# Patient Record
Sex: Female | Born: 1960 | Race: Black or African American | Hispanic: No | Marital: Single | Smoking: Never smoker
Health system: Southern US, Community
[De-identification: ages and names within clinical notes are randomized; demographics above are authoritative.]

## PROBLEM LIST (undated history)

## (undated) DIAGNOSIS — I10 Essential (primary) hypertension: Secondary | ICD-10-CM

## (undated) DIAGNOSIS — M1990 Unspecified osteoarthritis, unspecified site: Secondary | ICD-10-CM

## (undated) HISTORY — PX: BREAST LUMPECTOMY: SHX2

## (undated) HISTORY — DX: Essential (primary) hypertension: I10

---

## 2008-10-16 ENCOUNTER — Encounter: Payer: Self-pay | Admitting: Obstetrics & Gynecology

## 2008-10-16 ENCOUNTER — Ambulatory Visit: Payer: Self-pay | Admitting: Obstetrics and Gynecology

## 2008-10-16 LAB — CONVERTED CEMR LAB
HCT: 40.6 % (ref 36.0–46.0)
Hemoglobin: 14.4 g/dL (ref 12.0–15.0)
MCHC: 35.5 g/dL (ref 30.0–36.0)
MCV: 83.5 fL (ref 78.0–100.0)
RDW: 13.7 % (ref 11.5–15.5)

## 2008-10-28 ENCOUNTER — Ambulatory Visit (HOSPITAL_COMMUNITY): Admission: RE | Admit: 2008-10-28 | Discharge: 2008-10-28 | Payer: Self-pay | Admitting: Obstetrics and Gynecology

## 2010-06-29 ENCOUNTER — Encounter: Payer: Self-pay | Admitting: *Deleted

## 2010-10-20 NOTE — Group Therapy Note (Signed)
NAME:  [name], [name] NO.:  [id number]   MEDICAL RECORD NO.:  [id number]          PATIENT TYPE:  WOC   LOCATION:  [facility]                   FACILITY:  [facility]   PHYSICIAN:  Argentina Donovan, MD        DATE OF BIRTH:  [birth year]   DATE OF SERVICE:                                  CLINIC NOTE   HISTORY OF PRESENT ILLNESS:  Patient is a 50 year old female who was  referred from [facility] where she had received a Pap  in January that was normal and told that she had a large fibroid uterus  and that this was likely the cause of her urinary incontinence.  The  patient reports that she has had some urinary incontinence with coughing  and sneezing and occasionally some urge incontinence.  She states that  this is not to the point of inhibiting her function or affecting her  lifestyle.  She states that she preemptively will urinate every 2 hours  and this seems to help.  She does wear a pantyliner daily but states she  only has to change it twice a day, she states that it is rarely soaked.  She also does report having longer periods over the past year.  She  states that previously they used to last only 5 - 7 days and currently  over the past year they have been lasting up to 2 - 3 week and on  average 2 weeks.  Her last menstrual period was September 16, 2008.  She  states her periods are occurring monthly and denies any vaginal bleeding  between periods.  She states that she passes clots occasionally and that  the bleeding is varied from light spotting, to moderate, to heavy flow.  She has approximately 3 - 5 days of heavy bleeding and she will go  through 5 - 6 tampons a day.  She otherwise denies any symptoms of  orthostasis, dizziness or shortness of breath.   A 14 point review of systems was reviewed with patient and was negative  except as noted in the HPI.  Also, positive for some mild vasomotor  symptoms.   MEDICATIONS:  None.   ALLERGIES:  NO KNOWN  DRUG ALLERGIES.  SHE HAS NO LATEX ALLERGY.   GYN HISTORY:  Last Pap was July 01, 2008 and was normal per records.  Last mammogram was 1996.  Last menstrual period was September 16, 2008.  Age  of menarche was 40.  Cycles are typically every 30 days, lasting  approximately 18 days.  She uses no birth control methods.  She is G0  P0.  She has had one abnormal Pap smear approximately 20 years ago and  did not receive any treatment other than a repeat Pap smear that was  normal per patient report.   SURGICAL HISTORY:  Lumpectomy of the right breast in 1996.  She has  never received blood transfusion.   FAMILY HISTORY:  Significant for diabetes in her mother and father and  hypertension in her father.  No history of heart attacks or heart  disease or cancer.   PAST MEDICAL HISTORY:  None.  SOCIAL HISTORY:  Patient currently lives with her sister, brother-in-law  and niece.  She currently does not work outside the home.  She denies  any history of tobacco use, no alcohol use and she denies any drug use.  She does drink caffeinated beverages about 3 times a week.   LABS:  Pap smear dated July 02, 2008:  She was negative for  intraepithelial lesion or malignancy.   PHYSICAL EXAMINATION:  Temperature was 97.3, pulse is 70, blood pressure  right upper arms was 171/100, left lower arm or wrist was 136/83, weight  is 287 pounds or 130 kg.  GENERAL:  African American female who is in no acute distress, alert and  oriented, obese.  HEENT:  Mild conjunctival pallor, mucous membranes are moist.  Pupils  are equally round and reactive to light.  NECK:  Supple, there is no thyromegaly.  LUNGS:  Clear to auscultation bilaterally.  CARDIOVASCULAR:  Regular rate and rhythm, no murmurs.  She has 2+  peripheral pulses.  No clubbing and no cyanosis.  There are no carotid bruits.  ABDOMEN:  Soft, nontender, nondistended, bowel sounds are present.  GENITOURINARY:  There is no visible bladder  prolapse.  Cervix showed no  lesions or ulcerations, no discharge.  Bimanual exam was benign.  No  adnexal tenderness, no masses.  Exam was difficult secondary to large  body habitus.  EXTREMITIES:  Trace pedal edema, 2+ deep tendon reflexes.  PSYCH:  Normal affect, normal mood.   ASSESSMENT:  1. Menorrhagia.  2. Stress/urge incontinence.  3. Health maintenance.   PLAN:  1. For her menorrhagia we will order a transvaginal ultrasound and      check a CBC today to rule out anemia.  We will also be looking for      fibroids versus endometrial hypoplasia.  2. For her stress and urge incontinence, patient is currently not      limited in her function or lifestyle by this currently.  We      discussed Kegel's exercises and recommend that she do this daily.      We will also follow up on her transvaginal ultrasound to see if      there is indeed a large fibroid that could be contributing to this.  3. Health maintenance.  We will update her mammogram and we will refer      her for scholarship for this today.  She was also noted to have an      elevated blood pressure in clinic today.  We will have her follow      up in 2 weeks to go over results of her transvaginal ultrasound as      well as recheck her blood pressure. If it continues to be elevated      we will consider treatment at that time.  Patient was also advised      to check ambulatory blood pressures and bring those in at her next      followup.           ______________________________  Argentina Donovan, MD     PR/MEDQ  D:  10/16/2008  T:  10/16/2008  Job:  [id number]

## 2011-06-02 ENCOUNTER — Other Ambulatory Visit: Payer: Self-pay | Admitting: Obstetrics and Gynecology

## 2011-06-02 DIAGNOSIS — Z1231 Encounter for screening mammogram for malignant neoplasm of breast: Secondary | ICD-10-CM

## 2011-06-04 ENCOUNTER — Ambulatory Visit (INDEPENDENT_AMBULATORY_CARE_PROVIDER_SITE_OTHER): Payer: Self-pay | Admitting: *Deleted

## 2011-06-04 ENCOUNTER — Ambulatory Visit (HOSPITAL_COMMUNITY)
Admission: RE | Admit: 2011-06-04 | Discharge: 2011-06-04 | Disposition: A | Discharge status: Home / Self Care | Payer: Self-pay | Source: Ambulatory Visit | Attending: Obstetrics and Gynecology | Admitting: Obstetrics and Gynecology

## 2011-06-04 VITALS — BP 140/82 | HR 78 | Temp 98.5°F | Resp 18 | Ht 64.0 in | Wt 278.2 lb

## 2011-06-04 DIAGNOSIS — Z01419 Encounter for gynecological examination (general) (routine) without abnormal findings: Secondary | ICD-10-CM

## 2011-06-04 DIAGNOSIS — Z1231 Encounter for screening mammogram for malignant neoplasm of breast: Secondary | ICD-10-CM

## 2011-06-04 NOTE — Patient Instructions (Addendum)
Taught patient how to perform BSE and gave educational materials to take home. Informed patient if this Pap smear is normal will not need another Pap smear for 3 years per BCCCP guidelines. Patient escorted to mammography for a screening mammogram. Referred patient to one of our free skin cancer screenings in May 2013 to have the multiple moles on her breasts checked. Will mail patient flyer with dates and times. Let patient know will follow up with her within the next couple weeks with results by letter or phone. Patient verbalized understanding.

## 2011-06-04 NOTE — Progress Notes (Signed)
No complaints today.  Pap Smear:    Completed Pap smear today. Last Pap smear was February 2010 per patient. Per patient had an abnormal Pap smear 1993 with atypical cells and stated she had a repeat Pap smear that was normal to follow up. No Pap smear results in EPIC.  Physical exam: Breasts Breasts symmetrical. Multiple moles bilateral breasts mainly in the areolar area. No nipple retraction bilateral breasts. No nipple discharge bilateral breasts. No lymphadenopathy. No lumps palpated bilateral breasts. Referred patient to come to one of our free skin cancer screenings in May 2013 to have the moles checked on her breasts.          Pelvic/Bimanual   Ext Genitalia No lesions, no swelling and no discharge observed on external genitalia.         Vagina Vagina pink and normal texture. No lesions or discharge observed in vagina.          Cervix Cervix is present. Cervix pink and of normal texture. No discharge observed at cervical os.      Uterus Uterus is present and palpable. Uterus in normal position and normal size.       Adnexae Bilateral ovaries present and palpable. No tenderness on palpation.        Rectovaginal No rectal exam completed today since patient had no rectal complaints. No skin abnormalities observed on rectal area.

## 2011-06-16 ENCOUNTER — Encounter: Payer: Self-pay | Admitting: Obstetrics and Gynecology

## 2016-05-12 ENCOUNTER — Telehealth (INDEPENDENT_AMBULATORY_CARE_PROVIDER_SITE_OTHER): Payer: Self-pay | Admitting: Orthopaedic Surgery

## 2016-05-12 NOTE — Telephone Encounter (Signed)
Patient aware we did receive disc

## 2016-05-12 NOTE — Telephone Encounter (Signed)
Patient has appt. Tomorrow 12/7.Marland Kitchen.[facility] to know [facility] .please call pt [phone].

## 2016-05-13 ENCOUNTER — Ambulatory Visit (INDEPENDENT_AMBULATORY_CARE_PROVIDER_SITE_OTHER): Payer: BLUE CROSS/BLUE SHIELD | Admitting: Orthopaedic Surgery

## 2016-05-13 DIAGNOSIS — M1611 Unilateral primary osteoarthritis, right hip: Secondary | ICD-10-CM

## 2016-05-13 DIAGNOSIS — M1612 Unilateral primary osteoarthritis, left hip: Secondary | ICD-10-CM

## 2016-05-13 DIAGNOSIS — M25552 Pain in left hip: Secondary | ICD-10-CM | POA: Diagnosis not present

## 2016-05-13 DIAGNOSIS — M25551 Pain in right hip: Secondary | ICD-10-CM | POA: Diagnosis not present

## 2016-05-13 NOTE — Progress Notes (Signed)
Office Visit Note   Patient: [name]           Date of Birth: [birth year]           MRN: [id number] Visit Date: 05/13/2016              Requested by: No referring provider defined for this encounter. PCP: No PCP Per Patient   Assessment & Plan: Visit Diagnoses:  1. Pain of both hip joints   2. Unilateral primary osteoarthritis, left hip   3. Unilateral primary osteoarthritis, right hip     Plan: At this point her pain is daily. It is detrimentally affected her activities daily living, her mobility, and her quality of life. She is tried and failed all forms conservative treatment. Injections would not help at this point. She even has tried an assistive device, activity modification, and weight loss. I share with her her x-rays and we went over in detail what they look like. We also talked about hip replacement surgery under direct anterior approach. I showed her hip model and explained in detail with the surgery involves as well as a thorough discussion of the risks and benefits. Certainly her risk is heightened given her obesity. She is not a diabetic. I gave her our surgery scheduler's card and I'm happy to have this scheduled for a right hip replacement if she decides to have this done. I'll be happy to do my best good care of her. I described what the intraoperative and postoperative care would be as well. 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 I talked her about what it would take to get her back to that job when she decides whether or not she will have this surgery.  Follow-Up Instructions: Return if symptoms worsen or fail to improve.   Orders:  No orders of the defined types were placed in this encounter.  No orders of the defined types were placed in this encounter.     Procedures: No procedures performed   Clinical Data: No additional findings.   Subjective: Chief Complaint  Patient presents with  . Right Hip - Pain    Patient  having hip painx2 years. NKI. [place]    HPI Her sister is with her. Her pain is 10 out of 10. She is hurting on a daily basis. She is having trouble getting out of bed and getting into a car. She says when she first stands she has severe pain in the groin on both sides. She walks with significant limp as well. This is been getting worse for 2 years. Review of Systems She denies any headache, chest pain, shortness of breath, fever, chills, nausea, vomiting.  Objective: Vital Signs: There were no vitals taken for this visit.  Physical Exam She is alert and oriented 3 and in no acute distress. Ortho Exam Examination of both of her hips shows limitations with internal/external rotation and severe pain in the groin. I had her lay supine on the examination table second assess the soft tissue interval to get to her right hip. She does have pain in both of her knees and a slight valgus deformity of her knees. Specialty Comments:  No specialty comments available.  Imaging: No results found. X-rays accompany her show severe arthritis in both of her hips. The right is worse in the left. There is joint space narrowing, sclerotic changes and periarticular osteophytes. There also cystic  changes as well.  PMFS History: There are no active problems to display for this patient.  Past Medical History:  Diagnosis Date  . Hypertension     Family History  Problem Relation Age of Onset  . Diabetes Father   . Hypertension Father   . Diabetes Mother   . Hypertension Brother     No past surgical history on file. Social History   Occupational History  . Not on file.   Social History Main Topics  . Smoking status: Never Smoker  . Smokeless tobacco: Not on file  . Alcohol use No  . Drug use: No  . Sexual activity: No

## 2016-08-03 ENCOUNTER — Telehealth (INDEPENDENT_AMBULATORY_CARE_PROVIDER_SITE_OTHER): Payer: Self-pay | Admitting: Orthopaedic Surgery

## 2016-08-03 NOTE — Telephone Encounter (Signed)
Patient aware to stop 5 days before surgery

## 2016-08-03 NOTE — Telephone Encounter (Signed)
Patient called wanting to know when she should stop taking her 800 mg motrin before her surgery at the end of march. CB # [phone]

## 2016-08-06 NOTE — Progress Notes (Signed)
Need orders in epic for 3-23 surgery

## 2016-08-17 ENCOUNTER — Other Ambulatory Visit (INDEPENDENT_AMBULATORY_CARE_PROVIDER_SITE_OTHER): Payer: Self-pay | Admitting: Physician Assistant

## 2016-08-17 NOTE — Progress Notes (Signed)
Surgery on 08/27/16.  Preop on 08/20/16.  Need orders in epic.  Thank You

## 2016-08-18 ENCOUNTER — Other Ambulatory Visit (HOSPITAL_COMMUNITY): Payer: Self-pay | Admitting: Emergency Medicine

## 2016-08-18 ENCOUNTER — Encounter (HOSPITAL_COMMUNITY): Payer: Self-pay

## 2016-08-18 NOTE — Patient Instructions (Signed)
[name]  08/18/2016   Your procedure is scheduled on: 08-27-16  [facility] to 3rd floor to  Short Stay Center at 530AM.  Call this number if you have problems the morning of surgery [phone]   Remember: ONLY 1 PERSON MAY GO WITH YOU TO SHORT STAY TO GET  READY MORNING OF YOUR SURGERY.  Do not eat food or drink liquids :After Midnight.     Take these medicines the morning of surgery with A SIP OF WATER: none                                 You may not have any metal on your body including hair pins and              piercings  Do not wear jewelry, make-up, lotions, powders or perfumes, deodorant             Do not wear nail polish.  Do not shave  48 hours prior to surgery.              Men may shave face and neck.   Do not bring valuables to the hospital. [facility] IS NOT             RESPONSIBLE   FOR VALUABLES.  Contacts, dentures or bridgework may not be worn into surgery.  Leave suitcase in the car. After surgery it may be brought to your room.              Please read over the following fact sheets you were given: _____________________________________________________________________             [facility] - Preparing [facility] Before surgery, you can play an important role.  Because skin is not sterile, your skin needs to be as free of germs as possible.  You can reduce the number of germs on your skin by washing with CHG (chlorahexidine gluconate) soap before surgery.  CHG is an antiseptic cleaner which kills germs and bonds with the skin to continue killing germs even after washing. Please DO NOT use if you have an allergy to CHG or antibacterial soaps.  If your skin becomes reddened/irritated stop using the CHG and inform your nurse when you arrive at Short Stay. Do not shave (including legs and underarms) for at least 48 hours prior to the first CHG shower.  You may shave your  face/neck. Please follow these instructions carefully:  1.  Shower with CHG Soap the night before surgery and the  morning of Surgery.  2.  If you choose to wash your hair, wash your hair first as usual with your  normal  shampoo.  3.  After you shampoo, rinse your hair and body thoroughly to remove the  shampoo.                           4.  Use CHG as you would any other liquid soap.  You can apply chg directly  to the skin and wash                       Gently with a scrungie or clean washcloth.  5.  Apply the CHG Soap to your body ONLY  FROM THE NECK DOWN.   Do not use on face/ open                           Wound or open sores. Avoid contact with eyes, ears mouth and genitals (private parts).                       Wash face,  Genitals (private parts) with your normal soap.             6.  Wash thoroughly, paying special attention to the area where your surgery  will be performed.  7.  Thoroughly rinse your body with warm water from the neck down.  8.  DO NOT shower/wash with your normal soap after using and rinsing off  the CHG Soap.                9.  Pat yourself dry with a clean towel.            10.  Wear clean pajamas.            11.  Place clean sheets on your bed the night of your first shower and do not  sleep with pets. Day of Surgery : Do not apply any lotions/deodorants the morning of surgery.  Please wear clean clothes to the hospital/surgery center.  FAILURE TO FOLLOW THESE INSTRUCTIONS MAY RESULT IN THE CANCELLATION OF YOUR SURGERY PATIENT SIGNATURE_________________________________  NURSE SIGNATURE__________________________________  ________________________________________________________________________   [name]  An incentive spirometer is a tool that can help keep your lungs clear and active. This tool measures how well you are filling your lungs with each breath. Taking long deep breaths may help reverse or decrease the chance of developing breathing  (pulmonary) problems (especially infection) following:  A long period of time when you are unable to move or be active. BEFORE THE PROCEDURE   If the spirometer includes an indicator to show your best effort, your nurse or respiratory therapist will set it to a desired goal.  If possible, sit up straight or lean slightly forward. Try not to slouch.  Hold the incentive spirometer in an upright position. INSTRUCTIONS FOR USE  1. Sit on the edge of your bed if possible, or sit up as far as you can in bed or on a chair. 2. Hold the incentive spirometer in an upright position. 3. Breathe out normally. 4. Place the mouthpiece in your mouth and seal your lips tightly around it. 5. Breathe in slowly and as deeply as possible, raising the piston or the ball toward the top of the column. 6. Hold your breath for 3-5 seconds or for as long as possible. Allow the piston or ball to fall to the bottom of the column. 7. Remove the mouthpiece from your mouth and breathe out normally. 8. Rest for a few seconds and repeat Steps 1 through 7 at least 10 times every 1-2 hours when you are awake. Take your time and take a few normal breaths between deep breaths. 9. The spirometer may include an indicator to show your best effort. Use the indicator as a goal to work toward during each repetition. 10. After each set of 10 deep breaths, practice coughing to be sure your lungs are clear. If you have an incision (the cut made at the time of surgery), support your incision when coughing by placing a pillow or rolled up towels firmly against it. Once you  are able to get out of bed, walk around indoors and cough well. You may stop using the incentive spirometer when instructed by your caregiver.  RISKS AND COMPLICATIONS  Take your time so you do not get dizzy or light-headed.  If you are in pain, you may need to take or ask for pain medication before doing incentive spirometry. It is harder to take a deep breath if you  are having pain. AFTER USE  Rest and breathe slowly and easily.  It can be helpful to keep track of a log of your progress. Your caregiver can provide you with a simple table to help with this. If you are using the spirometer at home, follow these instructions: [place] IF:   You are having difficultly using the spirometer.  You have trouble using the spirometer as often as instructed.  Your pain medication is not giving enough relief while using the spirometer.  You develop fever of 100.5 F (38.1 C) or higher. SEEK IMMEDIATE MEDICAL CARE IF:   You cough up bloody sputum that had not been present before.  You develop fever of 102 F (38.9 C) or greater.  You develop worsening pain at or near the incision site. MAKE SURE YOU:   Understand these instructions.  Will watch your condition.  Will get help right away if you are not doing well or get worse. Document Released: 10/04/2006 Document Revised: 08/16/2011 Document Reviewed: 12/05/2006 [facility] Patient Information 2014 [place], [place].   ________________________________________________________________________

## 2016-08-20 ENCOUNTER — Encounter (HOSPITAL_COMMUNITY): Payer: Self-pay

## 2016-08-20 ENCOUNTER — Encounter (INDEPENDENT_AMBULATORY_CARE_PROVIDER_SITE_OTHER): Payer: Self-pay

## 2016-08-20 ENCOUNTER — Encounter (HOSPITAL_COMMUNITY)
Admission: RE | Admit: 2016-08-20 | Discharge: 2016-08-20 | Disposition: A | Discharge status: Home / Self Care | Payer: BLUE CROSS/BLUE SHIELD | Source: Ambulatory Visit | Attending: Orthopaedic Surgery | Admitting: Orthopaedic Surgery

## 2016-08-20 DIAGNOSIS — Z01812 Encounter for preprocedural laboratory examination: Secondary | ICD-10-CM | POA: Diagnosis not present

## 2016-08-20 DIAGNOSIS — M1611 Unilateral primary osteoarthritis, right hip: Secondary | ICD-10-CM | POA: Diagnosis not present

## 2016-08-20 DIAGNOSIS — Z01810 Encounter for preprocedural cardiovascular examination: Secondary | ICD-10-CM | POA: Insufficient documentation

## 2016-08-20 HISTORY — DX: Unspecified osteoarthritis, unspecified site: M19.90

## 2016-08-20 LAB — BASIC METABOLIC PANEL
ANION GAP: 5 (ref 5–15)
BUN: 19 mg/dL (ref 6–20)
CALCIUM: 9.2 mg/dL (ref 8.9–10.3)
CO2: 25 mmol/L (ref 22–32)
CREATININE: 0.82 mg/dL (ref 0.44–1.00)
Chloride: 111 mmol/L (ref 101–111)
Glucose, Bld: 84 mg/dL (ref 65–99)
Potassium: 3.8 mmol/L (ref 3.5–5.1)
Sodium: 141 mmol/L (ref 135–145)

## 2016-08-20 LAB — CBC
HCT: 41 % (ref 36.0–46.0)
HEMOGLOBIN: 14.3 g/dL (ref 12.0–15.0)
MCH: 28 pg (ref 26.0–34.0)
MCHC: 34.9 g/dL (ref 30.0–36.0)
MCV: 80.4 fL (ref 78.0–100.0)
PLATELETS: 186 10*3/uL (ref 150–400)
RBC: 5.1 MIL/uL (ref 3.87–5.11)
RDW: 13.6 % (ref 11.5–15.5)
WBC: 5.3 10*3/uL (ref 4.0–10.5)

## 2016-08-20 LAB — SURGICAL PCR SCREEN
MRSA, PCR: NEGATIVE
STAPHYLOCOCCUS AUREUS: NEGATIVE

## 2016-08-20 LAB — ABO/RH: ABO/RH(D): O POS

## 2016-08-26 ENCOUNTER — Encounter (HOSPITAL_COMMUNITY): Payer: Self-pay | Admitting: Anesthesiology

## 2016-08-26 MED ORDER — DEXTROSE 5 % IV SOLN
3.0000 g | INTRAVENOUS | Status: AC
  Administered 2016-08-27: 3 g via INTRAVENOUS
  Filled 2016-08-26: qty 3

## 2016-08-26 NOTE — Anesthesia Preprocedure Evaluation (Addendum)
Anesthesia Evaluation  Patient identified by MRN, date of birth, ID band Patient awake    Reviewed: Allergy & Precautions, NPO status , Patient's Chart, lab work & pertinent test results  Airway Mallampati: I  TM Distance: >3 FB Neck ROM: Full    Dental  (+) Teeth Intact, Dental Advisory Given   Pulmonary neg pulmonary ROS,    breath sounds clear to auscultation       Cardiovascular hypertension,  Rhythm:Regular Rate:Normal     Neuro/Psych negative neurological ROS  negative psych ROS   GI/Hepatic negative GI ROS, Neg liver ROS,   Endo/Other  negative endocrine ROS  Renal/GU negative Renal ROS  negative genitourinary   Musculoskeletal  (+) Arthritis , Osteoarthritis,    Abdominal   Peds negative pediatric ROS (+)  Hematology negative hematology ROS (+)   Anesthesia Other Findings   Reproductive/Obstetrics negative OB ROS                            Lab Results  Component Value Date   WBC 5.3 08/20/2016   HGB 14.3 08/20/2016   HCT 41.0 08/20/2016   MCV 80.4 08/20/2016   PLT 186 08/20/2016   No results found for: INR, PROTIME   Anesthesia Physical Anesthesia Plan  ASA: III  Anesthesia Plan: Spinal   Post-op Pain Management:    Induction: Intravenous  Airway Management Planned: Natural Airway  Additional Equipment:   Intra-op Plan:   Post-operative Plan:   Informed Consent: I have reviewed the patients History and Physical, chart, labs and discussed the procedure including the risks, benefits and alternatives for the proposed anesthesia with the patient or authorized representative who has indicated his/her understanding and acceptance.     Plan Discussed with: CRNA  Anesthesia Plan Comments:         Anesthesia Quick Evaluation

## 2016-08-27 ENCOUNTER — Inpatient Hospital Stay (HOSPITAL_COMMUNITY): Payer: BLUE CROSS/BLUE SHIELD | Admitting: Anesthesiology

## 2016-08-27 ENCOUNTER — Inpatient Hospital Stay (HOSPITAL_COMMUNITY)
Admission: RE | Admit: 2016-08-27 | Discharge: 2016-08-28 | DRG: 470 | Disposition: A | Discharge status: Home Health | Payer: BLUE CROSS/BLUE SHIELD | Source: Ambulatory Visit | Attending: Orthopaedic Surgery | Admitting: Orthopaedic Surgery

## 2016-08-27 ENCOUNTER — Inpatient Hospital Stay (HOSPITAL_COMMUNITY): Payer: BLUE CROSS/BLUE SHIELD

## 2016-08-27 ENCOUNTER — Encounter (HOSPITAL_COMMUNITY)
Admission: RE | Disposition: A | Discharge status: Home Health | Payer: Self-pay | Source: Ambulatory Visit | Attending: Orthopaedic Surgery

## 2016-08-27 ENCOUNTER — Encounter (HOSPITAL_COMMUNITY): Payer: Self-pay | Admitting: *Deleted

## 2016-08-27 DIAGNOSIS — I10 Essential (primary) hypertension: Secondary | ICD-10-CM | POA: Diagnosis present

## 2016-08-27 DIAGNOSIS — M1611 Unilateral primary osteoarthritis, right hip: Principal | ICD-10-CM

## 2016-08-27 DIAGNOSIS — Z96641 Presence of right artificial hip joint: Secondary | ICD-10-CM

## 2016-08-27 DIAGNOSIS — Z419 Encounter for procedure for purposes other than remedying health state, unspecified: Secondary | ICD-10-CM

## 2016-08-27 DIAGNOSIS — M1612 Unilateral primary osteoarthritis, left hip: Secondary | ICD-10-CM

## 2016-08-27 HISTORY — PX: TOTAL HIP ARTHROPLASTY: SHX124

## 2016-08-27 LAB — TYPE AND SCREEN
ABO/RH(D): O POS
ANTIBODY SCREEN: NEGATIVE

## 2016-08-27 SURGERY — ARTHROPLASTY, HIP, TOTAL, ANTERIOR APPROACH
Anesthesia: Spinal | Site: Hip | Laterality: Right

## 2016-08-27 MED ORDER — FENTANYL CITRATE (PF) 100 MCG/2ML IJ SOLN
INTRAMUSCULAR | Status: DC | PRN
  Administered 2016-08-27: 100 ug via INTRAVENOUS

## 2016-08-27 MED ORDER — ONDANSETRON HCL 4 MG/2ML IJ SOLN
INTRAMUSCULAR | Status: DC | PRN
  Administered 2016-08-27: 4 mg via INTRAVENOUS

## 2016-08-27 MED ORDER — DOCUSATE SODIUM 100 MG PO CAPS
100.0000 mg | ORAL_CAPSULE | Freq: Two times a day (BID) | ORAL | Status: DC
  Administered 2016-08-27 – 2016-08-28 (×2): 100 mg via ORAL
  Filled 2016-08-27 (×2): qty 1

## 2016-08-27 MED ORDER — PHENOL 1.4 % MT LIQD
1.0000 | OROMUCOSAL | Status: DC | PRN
  Filled 2016-08-27: qty 177

## 2016-08-27 MED ORDER — SODIUM CHLORIDE 0.9 % IR SOLN
Status: DC | PRN
  Administered 2016-08-27: 1000 mL

## 2016-08-27 MED ORDER — POLYETHYLENE GLYCOL 3350 17 G PO PACK
17.0000 g | PACK | Freq: Every day | ORAL | Status: DC | PRN
Start: 2016-08-27 — End: 2016-08-28

## 2016-08-27 MED ORDER — PROPOFOL 10 MG/ML IV BOLUS
INTRAVENOUS | Status: AC
  Filled 2016-08-27: qty 20

## 2016-08-27 MED ORDER — HYDROMORPHONE HCL 1 MG/ML IJ SOLN
0.2500 mg | INTRAMUSCULAR | Status: DC | PRN
  Administered 2016-08-27 (×2): 0.5 mg via INTRAVENOUS

## 2016-08-27 MED ORDER — ACETAMINOPHEN 10 MG/ML IV SOLN
INTRAVENOUS | Status: AC
  Filled 2016-08-27: qty 100

## 2016-08-27 MED ORDER — BISACODYL 10 MG RE SUPP: 10.0000 mg | Freq: Every day | RECTAL | Status: DC | PRN

## 2016-08-27 MED ORDER — ONDANSETRON HCL 4 MG/2ML IJ SOLN
INTRAMUSCULAR | Status: AC
  Filled 2016-08-27: qty 2

## 2016-08-27 MED ORDER — MEPERIDINE HCL 50 MG/ML IJ SOLN: 6.2500 mg | INTRAMUSCULAR | Status: DC | PRN

## 2016-08-27 MED ORDER — BUPIVACAINE IN DEXTROSE 0.75-8.25 % IT SOLN
INTRATHECAL | Status: DC | PRN
  Administered 2016-08-27: 2 mL via INTRATHECAL

## 2016-08-27 MED ORDER — ASPIRIN 81 MG PO CHEW
81.0000 mg | CHEWABLE_TABLET | Freq: Two times a day (BID) | ORAL | Status: DC
  Administered 2016-08-27 – 2016-08-28 (×2): 81 mg via ORAL
  Filled 2016-08-27 (×2): qty 1

## 2016-08-27 MED ORDER — DIPHENHYDRAMINE HCL 12.5 MG/5ML PO ELIX: 12.5000 mg | ORAL_SOLUTION | ORAL | Status: DC | PRN

## 2016-08-27 MED ORDER — METHOCARBAMOL 1000 MG/10ML IJ SOLN
500.0000 mg | Freq: Four times a day (QID) | INTRAVENOUS | Status: DC | PRN
  Filled 2016-08-27: qty 5

## 2016-08-27 MED ORDER — PHENYLEPHRINE HCL 10 MG/ML IJ SOLN
INTRAVENOUS | Status: DC | PRN
  Administered 2016-08-27: 35 ug/min via INTRAVENOUS

## 2016-08-27 MED ORDER — TRANEXAMIC ACID 1000 MG/10ML IV SOLN
1000.0000 mg | INTRAVENOUS | Status: AC
  Administered 2016-08-27: 1000 mg via INTRAVENOUS
  Filled 2016-08-27: qty 1100

## 2016-08-27 MED ORDER — MIDAZOLAM HCL 5 MG/5ML IJ SOLN
INTRAMUSCULAR | Status: DC | PRN
  Administered 2016-08-27: 2 mg via INTRAVENOUS

## 2016-08-27 MED ORDER — HYDROMORPHONE HCL 1 MG/ML IJ SOLN
INTRAMUSCULAR | Status: AC
  Filled 2016-08-27: qty 0.5

## 2016-08-27 MED ORDER — PROMETHAZINE HCL 25 MG/ML IJ SOLN: 6.2500 mg | INTRAMUSCULAR | Status: DC | PRN

## 2016-08-27 MED ORDER — STERILE WATER FOR IRRIGATION IR SOLN
Status: DC | PRN
  Administered 2016-08-27: 2000 mL

## 2016-08-27 MED ORDER — BUPIVACAINE IN DEXTROSE 0.75-8.25 % IT SOLN: INTRATHECAL | Status: DC | PRN

## 2016-08-27 MED ORDER — ACETAMINOPHEN 10 MG/ML IV SOLN
INTRAVENOUS | Status: DC | PRN
  Administered 2016-08-27: 1000 mg via INTRAVENOUS

## 2016-08-27 MED ORDER — METHOCARBAMOL 500 MG PO TABS
500.0000 mg | ORAL_TABLET | Freq: Four times a day (QID) | ORAL | Status: DC | PRN
Start: 2016-08-27 — End: 2016-08-28
  Administered 2016-08-27 – 2016-08-28 (×3): 500 mg via ORAL
  Filled 2016-08-27 (×3): qty 1

## 2016-08-27 MED ORDER — HYDRALAZINE HCL 20 MG/ML IJ SOLN
5.0000 mg | Freq: Four times a day (QID) | INTRAMUSCULAR | Status: DC | PRN
  Administered 2016-08-27: 5 mg via INTRAVENOUS
  Filled 2016-08-27: qty 1

## 2016-08-27 MED ORDER — CHLORHEXIDINE GLUCONATE 4 % EX LIQD: 60.0000 mL | Freq: Once | CUTANEOUS | Status: DC

## 2016-08-27 MED ORDER — LACTATED RINGERS IV SOLN
INTRAVENOUS | Status: DC
  Administered 2016-08-27: 07:00:00 via INTRAVENOUS

## 2016-08-27 MED ORDER — PROPOFOL 10 MG/ML IV BOLUS
INTRAVENOUS | Status: AC
  Filled 2016-08-27: qty 40

## 2016-08-27 MED ORDER — PROPOFOL 500 MG/50ML IV EMUL
INTRAVENOUS | Status: DC | PRN
  Administered 2016-08-27: 75 ug/kg/min via INTRAVENOUS

## 2016-08-27 MED ORDER — CEFAZOLIN SODIUM-DEXTROSE 2-4 GM/100ML-% IV SOLN
2.0000 g | Freq: Four times a day (QID) | INTRAVENOUS | Status: AC
  Administered 2016-08-27 (×2): 2 g via INTRAVENOUS
  Filled 2016-08-27 (×2): qty 100

## 2016-08-27 MED ORDER — METOCLOPRAMIDE HCL 5 MG/ML IJ SOLN: 5.0000 mg | Freq: Three times a day (TID) | INTRAMUSCULAR | Status: DC | PRN

## 2016-08-27 MED ORDER — ALUM & MAG HYDROXIDE-SIMETH 200-200-20 MG/5ML PO SUSP
30.0000 mL | ORAL | Status: DC | PRN
Start: 2016-08-27 — End: 2016-08-28

## 2016-08-27 MED ORDER — SODIUM CHLORIDE 0.9 % IV SOLN
INTRAVENOUS | Status: DC
  Administered 2016-08-27 – 2016-08-28 (×2): via INTRAVENOUS

## 2016-08-27 MED ORDER — DEXAMETHASONE SODIUM PHOSPHATE 10 MG/ML IJ SOLN
INTRAMUSCULAR | Status: DC | PRN
  Administered 2016-08-27: 10 mg via INTRAVENOUS

## 2016-08-27 MED ORDER — ACETAMINOPHEN 325 MG PO TABS
650.0000 mg | ORAL_TABLET | Freq: Four times a day (QID) | ORAL | Status: DC | PRN
  Administered 2016-08-27: 650 mg via ORAL
  Filled 2016-08-27: qty 2

## 2016-08-27 MED ORDER — LACTATED RINGERS IV SOLN
INTRAVENOUS | Status: DC | PRN
  Administered 2016-08-27 (×2): via INTRAVENOUS

## 2016-08-27 MED ORDER — HYDROMORPHONE HCL 1 MG/ML IJ SOLN: 1.0000 mg | INTRAMUSCULAR | Status: DC | PRN

## 2016-08-27 MED ORDER — LACTATED RINGERS IV SOLN
INTRAVENOUS | Status: DC
  Administered 2016-08-27: 10:00:00 via INTRAVENOUS

## 2016-08-27 MED ORDER — MIDAZOLAM HCL 2 MG/2ML IJ SOLN
INTRAMUSCULAR | Status: AC
  Filled 2016-08-27: qty 2

## 2016-08-27 MED ORDER — FENTANYL CITRATE (PF) 100 MCG/2ML IJ SOLN
INTRAMUSCULAR | Status: AC
  Filled 2016-08-27: qty 2

## 2016-08-27 MED ORDER — METOCLOPRAMIDE HCL 5 MG PO TABS: 5.0000 mg | ORAL_TABLET | Freq: Three times a day (TID) | ORAL | Status: DC | PRN

## 2016-08-27 MED ORDER — ONDANSETRON HCL 4 MG PO TABS: 4.0000 mg | ORAL_TABLET | Freq: Four times a day (QID) | ORAL | Status: DC | PRN

## 2016-08-27 MED ORDER — ONDANSETRON HCL 4 MG/2ML IJ SOLN: 4.0000 mg | Freq: Four times a day (QID) | INTRAMUSCULAR | Status: DC | PRN

## 2016-08-27 MED ORDER — MENTHOL 3 MG MT LOZG: 1.0000 | LOZENGE | OROMUCOSAL | Status: DC | PRN

## 2016-08-27 MED ORDER — OXYCODONE HCL 5 MG PO TABS
5.0000 mg | ORAL_TABLET | ORAL | Status: DC | PRN
  Administered 2016-08-27: 15:00:00 5 mg via ORAL
  Administered 2016-08-27: 22:00:00 10 mg via ORAL
  Administered 2016-08-27: 5 mg via ORAL
  Administered 2016-08-27 – 2016-08-28 (×4): 10 mg via ORAL
  Filled 2016-08-27: qty 1
  Filled 2016-08-27 (×2): qty 2
  Filled 2016-08-27: qty 1
  Filled 2016-08-27 (×3): qty 2

## 2016-08-27 MED ORDER — PROPOFOL 10 MG/ML IV BOLUS
INTRAVENOUS | Status: DC | PRN
  Administered 2016-08-27: 20 mg via INTRAVENOUS

## 2016-08-27 MED ORDER — ACETAMINOPHEN 650 MG RE SUPP: 650.0000 mg | Freq: Four times a day (QID) | RECTAL | Status: DC | PRN

## 2016-08-27 SURGICAL SUPPLY — 39 items
BAG ZIPLOCK 12X15 (MISCELLANEOUS) ×3 IMPLANT
BLADE SAW SGTL 18X1.27X75 (BLADE) ×2 IMPLANT
BLADE SAW SGTL 18X1.27X75MM (BLADE) ×1
CAPT HIP TOTAL 2 ×3 IMPLANT
CELLS DAT CNTRL 66122 CELL SVR (MISCELLANEOUS) ×1 IMPLANT
CLOTH BEACON ORANGE TIMEOUT ST (SAFETY) ×3 IMPLANT
COVER PERINEAL POST (MISCELLANEOUS) ×3 IMPLANT
DRAPE STERI IOBAN 125X83 (DRAPES) ×3 IMPLANT
DRAPE U-SHAPE 47X51 STRL (DRAPES) ×6 IMPLANT
DRESSING AQUACEL AG SP 3.5X10 (GAUZE/BANDAGES/DRESSINGS) ×1 IMPLANT
DRSG AQUACEL AG ADV 3.5X10 (GAUZE/BANDAGES/DRESSINGS) ×3 IMPLANT
DRSG AQUACEL AG SP 3.5X10 (GAUZE/BANDAGES/DRESSINGS) ×3
DURAPREP 26ML APPLICATOR (WOUND CARE) ×3 IMPLANT
ELECT REM PT RETURN 15FT ADLT (MISCELLANEOUS) ×3 IMPLANT
GAUZE XEROFORM 1X8 LF (GAUZE/BANDAGES/DRESSINGS) ×3 IMPLANT
GLOVE BIO SURGEON STRL SZ7.5 (GLOVE) ×3 IMPLANT
GLOVE BIOGEL PI IND STRL 7.5 (GLOVE) ×4 IMPLANT
GLOVE BIOGEL PI IND STRL 8 (GLOVE) ×2 IMPLANT
GLOVE BIOGEL PI INDICATOR 7.5 (GLOVE) ×8
GLOVE BIOGEL PI INDICATOR 8 (GLOVE) ×4
GLOVE ECLIPSE 8.0 STRL XLNG CF (GLOVE) ×3 IMPLANT
GLOVE SURG SS PI 7.5 STRL IVOR (GLOVE) ×6 IMPLANT
GOWN SPEC L3 XXLG W/TWL (GOWN DISPOSABLE) ×3 IMPLANT
GOWN STRL REUS W/ TWL XL LVL3 (GOWN DISPOSABLE) ×1 IMPLANT
GOWN STRL REUS W/TWL XL LVL3 (GOWN DISPOSABLE) ×8 IMPLANT
HANDPIECE INTERPULSE COAX TIP (DISPOSABLE) ×2
HOLDER FOLEY CATH W/STRAP (MISCELLANEOUS) ×3 IMPLANT
PACK ANTERIOR HIP CUSTOM (KITS) ×3 IMPLANT
RTRCTR WOUND ALEXIS 18CM MED (MISCELLANEOUS) ×3
SET HNDPC FAN SPRY TIP SCT (DISPOSABLE) ×1 IMPLANT
STAPLER VISISTAT 35W (STAPLE) ×3 IMPLANT
SUT ETHIBOND NAB CT1 #1 30IN (SUTURE) ×3 IMPLANT
SUT MNCRL AB 4-0 PS2 18 (SUTURE) IMPLANT
SUT VIC AB 0 CT1 36 (SUTURE) ×3 IMPLANT
SUT VIC AB 1 CT1 36 (SUTURE) ×3 IMPLANT
SUT VIC AB 2-0 CT1 27 (SUTURE) ×4
SUT VIC AB 2-0 CT1 TAPERPNT 27 (SUTURE) ×2 IMPLANT
TRAY FOLEY CATH SILVER 14FR (SET/KITS/TRAYS/PACK) ×3 IMPLANT
YANKAUER SUCT BULB TIP 10FT TU (MISCELLANEOUS) ×3 IMPLANT

## 2016-08-27 NOTE — Anesthesia Procedure Notes (Signed)
Spinal  Patient location during procedure: OR End time: 08/27/2016 7:42 AM Staffing Resident/CRNA: Enrigue Catena E Performed: resident/CRNA  Preanesthetic Checklist Completed: patient identified, site marked, surgical consent, pre-op evaluation, timeout performed, IV checked, risks and benefits discussed and monitors and equipment checked Spinal Block Patient position: sitting Prep: DuraPrep Patient monitoring: heart rate, continuous pulse ox and blood pressure Location: L3-4 Injection technique: single-shot Needle Needle type: Pencan  Needle gauge: 24 G Needle length: 9 cm Assessment Sensory level: T4 Additional Notes Expiration date of kit checked and confirmed. Patient tolerated procedure well, without complications.

## 2016-08-27 NOTE — Anesthesia Postprocedure Evaluation (Addendum)
Anesthesia Post Note  Patient: [name]  Procedure(s) Performed: Procedure(s) (LRB): RIGHT TOTAL HIP ARTHROPLASTY ANTERIOR APPROACH (Right)  Patient location during evaluation: PACU Anesthesia Type: Spinal Level of consciousness: oriented and awake and alert Pain management: pain level controlled Vital Signs Assessment: post-procedure vital signs reviewed and stable Respiratory status: spontaneous breathing, respiratory function stable and patient connected to nasal cannula oxygen Cardiovascular status: blood pressure returned to baseline and stable Postop Assessment: no headache, no backache and spinal receding Anesthetic complications: no       Last Vitals:  Vitals:   08/27/16 1145 08/27/16 1246  BP: (!) 173/86 (!) 188/89  Pulse: 65 70  Resp: 18   Temp: 36.5 C 36.8 C    Last Pain:  Vitals:   08/27/16 1246  TempSrc: Oral  PainSc:                  Shelton SilvasKevin D Hollis

## 2016-08-27 NOTE — H&P (Signed)
TOTAL HIP ADMISSION H&P  Patient is admitted for right total hip arthroplasty.  Subjective:  Chief Complaint: right hip pain  HPI: [name], 56 y.o. female, has a history of pain and functional disability in the right hip(s) due to arthritis and patient has failed non-surgical conservative treatments for greater than 12 weeks to include NSAID's and/or analgesics, corticosteriod injections, use of assistive devices, weight reduction as appropriate and activity modification.  Onset of symptoms was gradual starting 3 years ago with gradually worsening course since that time.The patient noted no past surgery on the right hip(s).  Patient currently rates pain in the right hip at 10 out of 10 with activity. Patient has night pain, worsening of pain with activity and weight bearing, pain that interfers with activities of daily living and pain with passive range of motion. Patient has evidence of subchondral cysts, subchondral sclerosis, periarticular osteophytes and joint space narrowing by imaging studies. This condition presents safety issues increasing the risk of falls.  There is no current active infection.  Patient Active Problem List   Diagnosis Date Noted  . Unilateral primary osteoarthritis, right hip 08/27/2016   Past Medical History:  Diagnosis Date  . Arthritis   . Hypertension     Past Surgical History:  Procedure Laterality Date  . BREAST LUMPECTOMY     unaware which breast     Prescriptions Prior to Admission  Medication Sig Dispense Refill Last Dose  . ibuprofen (ADVIL,MOTRIN) 200 MG tablet Take 800 mg by mouth every 8 (eight) hours as needed for mild pain or moderate pain.   08/19/2016   No Known Allergies  Social History  Substance Use Topics  . Smoking status: Never Smoker  . Smokeless tobacco: Never Used  . Alcohol use Yes     Comment: occasionally     Family History  Problem Relation Age of Onset  . Diabetes Father   . Hypertension Father   . Diabetes  Mother   . Hypertension Brother      Review of Systems  Musculoskeletal: Positive for joint pain.  All other systems reviewed and are negative.   Objective:  Physical Exam  Constitutional: She is oriented to person, place, and time. She appears well-developed and well-nourished.  HENT:  Head: Normocephalic and atraumatic.  Eyes: EOM are normal. Pupils are equal, round, and reactive to light.  Neck: Normal range of motion. Neck supple.  Cardiovascular: Normal rate and regular rhythm.   Respiratory: Effort normal and breath sounds normal.  GI: Soft. Bowel sounds are normal.  Musculoskeletal:       Right hip: She exhibits decreased range of motion, decreased strength, tenderness and bony tenderness.  Neurological: She is alert and oriented to person, place, and time.  Skin: Skin is warm and dry.  Psychiatric: She has a normal mood and affect.    Vital signs in last 24 hours: Temp:  [97.7 F (36.5 C)] 97.7 F (36.5 C) (03/23 0542) Pulse Rate:  [86] 86 (03/23 0542) Resp:  [18] 18 (03/23 0542) BP: (139)/(85) 139/85 (03/23 0542) SpO2:  [98 %] 98 % (03/23 0542) Weight:  [278 lb (126.1 kg)] 278 lb (126.1 kg) (03/23 0612)  Labs:   Estimated body mass index is 48.47 kg/m as calculated from the following:   Height as of this encounter: 5' 3.5" (1.613 m).   Weight as of this encounter: 278 lb (126.1 kg).   Imaging Review Plain radiographs demonstrate severe degenerative joint disease of the right hip(s). The bone quality  appears to be excellent for age and reported activity level.  Assessment/Plan:  End stage arthritis, right hip(s)  The patient history, physical examination, clinical judgement of the provider and imaging studies are consistent with end stage degenerative joint disease of the right hip(s) and total hip arthroplasty is deemed medically necessary. The treatment options including medical management, injection therapy, arthroscopy and arthroplasty were discussed  at length. The risks and benefits of total hip arthroplasty were presented and reviewed. The risks due to aseptic loosening, infection, stiffness, dislocation/subluxation,  thromboembolic complications and other imponderables were discussed.  The patient acknowledged the explanation, agreed to proceed with the plan and consent was signed. Patient is being admitted for inpatient treatment for surgery, pain control, PT, OT, prophylactic antibiotics, VTE prophylaxis, progressive ambulation and ADL's and discharge planning.The patient is planning to be discharged home with home health services

## 2016-08-27 NOTE — Evaluation (Signed)
Physical Therapy Evaluation Patient Details Name: [name] MRN: [id number] DOB: 16-Nov-1960 Today's Date: 08/27/2016   History of Present Illness  Pt s/p R THR  Clinical Impression  Pt s/p R THR and presents with decreased R LE strength/ROM and post op pain limiting functional mobility.  Pt should progress to dc home with family assist.    Follow Up Recommendations Home health PT    Equipment Recommendations  None recommended by PT    Recommendations for Other Services OT consult     Precautions / Restrictions Precautions Precautions: Fall Restrictions Weight Bearing Restrictions: No Other Position/Activity Restrictions: WBAT      Mobility  Bed Mobility Overal bed mobility: Needs Assistance Bed Mobility: Sit to Supine       Sit to supine: Min guard   General bed mobility comments: cues for sequence and use of R LE to self assist  Transfers Overall transfer level: Needs assistance Equipment used: Rolling walker (2 wheeled) Transfers: Sit to/from Stand Sit to Stand: Min guard         General transfer comment: cues for LE management and use of UEs to self assist  Ambulation/Gait Ambulation/Gait assistance: Min guard Ambulation Distance (Feet): 111 Feet Assistive device: Rolling walker (2 wheeled) Gait Pattern/deviations: Step-to pattern;Decreased step length - right;Decreased step length - left;Shuffle;Trunk flexed Gait velocity: decr Gait velocity interpretation: Below normal speed for age/gender General Gait Details: cues for sequence and position from [employer]  Stairs            Wheelchair Mobility    Modified Rankin (Stroke Patients Only)       Balance                                             Pertinent Vitals/Pain Pain Assessment: 0-10 Pain Score: 5  Pain Location: R hip Pain Descriptors / Indicators: Aching;Sore Pain Intervention(s): Limited activity within patient's tolerance;Monitored during  session;Premedicated before session;Ice applied    Home Living Family/patient expects to be discharged to:: Private residence Living Arrangements: Other relatives Available Help at Discharge: Family Type of Home: House Home Access: Stairs to enter Entrance Stairs-Rails: Right;Left;Can reach both Entrance Stairs-Number of Steps: 3 Home Layout: One level Home Equipment: Environmental consultantWalker - 2 wheels;Cane - single point      Prior Function Level of Independence: Independent               Hand Dominance        Extremity/Trunk Assessment   Upper Extremity Assessment Upper Extremity Assessment: Overall WFL for tasks assessed    Lower Extremity Assessment Lower Extremity Assessment: RLE deficits/detail    Cervical / Trunk Assessment Cervical / Trunk Assessment: Normal  Communication   Communication: No difficulties  Cognition Arousal/Alertness: Awake/alert Behavior During Therapy: WFL for tasks assessed/performed Overall Cognitive Status: Within Functional Limits for tasks assessed                                        General Comments      Exercises Total Joint Exercises Ankle Circles/Pumps: AROM;Both;15 reps;Supine   Assessment/Plan    PT Assessment Patient needs continued PT services  PT Problem List Decreased strength;Decreased range of motion;Decreased activity tolerance;Decreased mobility;Decreased knowledge of use of DME;Pain;Obesity       PT Treatment Interventions DME  instruction;Gait training;Stair training;Functional mobility training;Therapeutic activities;Therapeutic exercise;Patient/family education    PT Goals (Current goals can be found in the Care Plan section)  Acute Rehab PT Goals Patient Stated Goal: Regain IND and walk without pain PT Goal Formulation: With patient Time For Goal Achievement: 08/31/16 Potential to Achieve Goals: Good    Frequency 7X/week   Barriers to discharge        Co-evaluation                End of Session   Activity Tolerance: Patient tolerated treatment well Patient left: in bed;with call bell/phone within reach;with family/visitor present Nurse Communication: Mobility status PT Visit Diagnosis: Unsteadiness on feet (R26.81)    Time: 1610-9604 PT Time Calculation (min) (ACUTE ONLY): 28 min   Charges:   PT Evaluation $PT Eval Low Complexity: 1 Procedure PT Treatments $Gait Training: 8-22 mins   PT G Codes:        [phone]  Quynh Basso 08/27/2016, 5:31 PM

## 2016-08-27 NOTE — Brief Op Note (Signed)
08/27/2016  8:59 AM  PATIENT:  Natasha ChromanAntoinette V Wu  56 y.o. female  PRE-OPERATIVE DIAGNOSIS:  osteoarthritis right hip  POST-OPERATIVE DIAGNOSIS:  osteoarthritis right hip  PROCEDURE:  Procedure(s): RIGHT TOTAL HIP ARTHROPLASTY ANTERIOR APPROACH (Right)  SURGEON:  Surgeon(s) and Role:    * Kathryne Hitchhristopher Y Xitlalli Newhard, MD - Primary  PHYSICIAN ASSISTANT: Rexene EdisonGil Clark, PA-C  ANESTHESIA:   spinal  EBL:  Total I/O In: 1000 [I.V.:1000] Out: 250 [Urine:150; Blood:100]  COUNTS:  YES  DICTATION: .Other Dictation: Dictation Number [id number]  PLAN OF CARE: Admit to inpatient   PATIENT DISPOSITION:  PACU - hemodynamically stable.   Delay start of Pharmacological VTE agent (>24hrs) due to surgical blood loss or risk of bleeding: no

## 2016-08-27 NOTE — Op Note (Signed)
[name], [name] NO.:  [id number]  MEDICAL RECORD NO.:  [id number]  LOCATION:  [facility]                         FACILITY:  [facility]  PHYSICIAN:  [name], M.D.DATE OF BIRTH:  April 14, 1961  DATE OF PROCEDURE:  08/27/2016 DATE OF DISCHARGE:                              OPERATIVE REPORT   PREOPERATIVE DIAGNOSIS:  Primary osteoarthritis and degenerative joint disease, right hip.  POSTOPERATIVE DIAGNOSIS:  Primary osteoarthritis and degenerative joint disease, right hip.  PROCEDURE:  Right total hip arthroplasty through direct anterior approach.  IMPLANTS:  DePuy Sector Gription acetabular component size 52, size 36+ 0 polyethylene liner, size 11 Corail femoral component with standard offset, size 36+ 1.5 ceramic hip ball.  SURGEON:  [name], M.D.  ASSISTANT:  Natasha Canal, PA-C.  ANESTHESIA:  Spinal.  ANTIBIOTICS:  2 g of IV Ancef.  BLOOD LOSS:  Less than 200 mL.  COMPLICATIONS:  None.  INDICATIONS:  [name] is a 56 year old female, well known to me. She has debilitating arthritis involving her right hip.  She has tried and failed all forms of conservative treatment.  Her pain is daily and has detrimentally affected her activities of daily living, her quality of life, and her mobility.  At this point, she does wish to proceed with total hip arthroplasty.  She understands the risk of acute blood loss anemia, nerve and vessel injury, fracture, infection, dislocation, and DVT.  She understands our goals are decreased pain, improved mobility, and overall improved quality of life.  PROCEDURE DESCRIPTION:  After informed consent was obtained, appropriate right hip was marked.  She was brought to the operating room.  Spinal anesthesia was obtained while she was on her stretcher.  She was then placed in supine position on the stretcher.  A Foley catheter was placed and then both feet had traction boots applied to them.  Next,  she was placed supine on the Hana fracture table with the perineal post in place and both legs in inline skeletal traction devices, but no traction applied.  Her right operative hip was then prepped and draped with DuraPrep and sterile drapes.  Time-out was called to identify correct patient, correct right hip.  We then made an incision just inferior and posterior to the anterior superior iliac spine and carried this obliquely down the leg.  We dissected down the tensor fascia lata muscle.  The tensor fascia was then divided longitudinally to proceed with a direct anterior approach to the hip.  We identified and cauterized the circumflex vessels and identified the hip capsule.  I opened up the hip capsule in an L-type format finding a large joint effusion and significant periarticular osteophytes.  We did find complete cartilage wear of the femoral head.  We made our femoral neck cut with an oscillating saw just proximal to the lesser trochanter and completed this on osteotome.  We placed a corkscrew guide in the femoral head and removed the femoral head in its entirety, found to be devoid of cartilage.  We then placed a bent Hohmann over the medial acetabular rim, cerebellar retractor proximally.  We began reaming under direct visualization from a size 42 reamer in stepwise increments up to a size 52 with  all reamers under direct visualization.  The last reamer under direct fluoroscopy, so I could obtain my depth of reaming, my inclination and anteversion.  Once I was pleased with this, I placed the real DePuy Sector Gription acetabular component size 52 and a 36+ 0 neutral polyethylene liner for a size 52 acetabular component. Attention was then turned to the femur.  With the leg externally rotated to 120 degrees, extended and adducted, we were able to place a Mueller retractor medially and a Hohmann retractor behind the greater trochanter.  I released the lateral joint capsule and used  a box cutting osteotome to enter femoral Wu and a rongeur to lateralize.  I then began broaching from a size 8 broach using the Corail broaching system up to a size 11.  With the size 11 in place, we trialed a standard offset femoral neck and a 36+ 1.5 hip ball.  We brought the leg back over and up with traction and rotation reducing the pelvis, and we were pleased with leg length, offset, and stability.  We then dislocated the hip and removed the trial components.  We were able to place the real Corail femoral component size 11 with standard offset and the real 36+ 1.5 ceramic hip ball and again reduced in the pelvis and we were pleased with stability.  We then irrigated the soft tissue with normal saline solution using pulsatile lavage.  We closed the joint capsule with interrupted #1 Ethibond suture, followed by running #1 Vicryl in the tensor fascia, 0 Vicryl in the deep tissue, 2-0 Vicryl subcutaneous tissue, and interrupted staples on the skin.  Xeroform and Aquacel dressing were applied.  She was taken off the Hana table and taken to the recovery room in stable condition.  All final counts were correct. There were no complications noted.  Of note, Natasha CanalGilbert Clark, PA-C, assisted in entire case.  His assistance was crucial for facilitating all aspects of this case.     Natasha Pandahristopher Y. Magnus Wu, M.D.     CYB/MEDQ  D:  08/27/2016  T:  08/27/2016  Job:  [id number]

## 2016-08-27 NOTE — Transfer of Care (Signed)
Immediate Anesthesia Transfer of Care Note  Patient: [name]  Procedure(s) Performed: Procedure(s): RIGHT TOTAL HIP ARTHROPLASTY ANTERIOR APPROACH (Right)  Patient Location: PACU  Anesthesia Type:Spinal  Level of Consciousness: awake, alert , oriented and patient cooperative  Airway & Oxygen Therapy: Patient Spontanous Breathing and Patient connected to face mask oxygen  Post-op Assessment: Report given to RN and Post -op Vital signs reviewed and stable  Post vital signs: stable  Last Vitals:  Vitals:   08/27/16 0542  BP: 139/85  Pulse: 86  Resp: 18  Temp: 36.5 C    Last Pain:  Vitals:   08/27/16 0612  TempSrc:   PainSc: 7       Patients Stated Pain Goal: 4 (08/27/16 0612)  Complications: No apparent anesthesia complications L3 spinal level

## 2016-08-28 LAB — BASIC METABOLIC PANEL
ANION GAP: 7 (ref 5–15)
BUN: 9 mg/dL (ref 6–20)
CHLORIDE: 105 mmol/L (ref 101–111)
CO2: 26 mmol/L (ref 22–32)
CREATININE: 0.84 mg/dL (ref 0.44–1.00)
Calcium: 8.6 mg/dL — ABNORMAL LOW (ref 8.9–10.3)
GFR calc non Af Amer: >60 mL/min (ref >60)
Glucose, Bld: 117 mg/dL — ABNORMAL HIGH (ref 65–99)
Potassium: 4 mmol/L (ref 3.5–5.1)
Sodium: 138 mmol/L (ref 135–145)

## 2016-08-28 LAB — CBC
HEMATOCRIT: 36.4 % (ref 36.0–46.0)
HEMOGLOBIN: 12.4 g/dL (ref 12.0–15.0)
MCH: 28 pg (ref 26.0–34.0)
MCHC: 34.1 g/dL (ref 30.0–36.0)
MCV: 82.2 fL (ref 78.0–100.0)
Platelets: 197 10*3/uL (ref 150–400)
RBC: 4.43 MIL/uL (ref 3.87–5.11)
RDW: 13.9 % (ref 11.5–15.5)
WBC: 7.6 10*3/uL (ref 4.0–10.5)

## 2016-08-28 MED ORDER — ASPIRIN 81 MG PO CHEW: 81.0000 mg | CHEWABLE_TABLET | Freq: Two times a day (BID) | ORAL | 0 refills | Status: DC

## 2016-08-28 MED ORDER — METHOCARBAMOL 500 MG PO TABS: 500.0000 mg | ORAL_TABLET | Freq: Four times a day (QID) | ORAL | 0 refills | Status: DC | PRN

## 2016-08-28 MED ORDER — OXYCODONE-ACETAMINOPHEN 5-325 MG PO TABS: 1.0000 | ORAL_TABLET | ORAL | 0 refills | Status: DC | PRN

## 2016-08-28 NOTE — Care Management Note (Signed)
Case Management Note  Patient Details  Name: [name] MRN: [id number] Date of Birth: 05/29/1961  Subjective/Objective:   s/p R THR                 Action/Plan: Discharge Planning: AVS reviewed: NCM spoke to pt and [facility]. Pt was preoperatively arranged with [facility]. Pt states she has RW and 3n1 at home. Pt states her sister is a Adult nursehysical Therapist. Explained [facility] will call to arrange appt and if she declines HH PT at that time to make agency aware.    Expected Discharge Date:  08/28/16               Expected Discharge Plan:  Home w Home Health Services  In-House Referral:  NA  Discharge planning Services  CM Consult  Post Acute Care Choice:  Home Health Choice offered to:  Patient  DME Arranged:  N/A DME Agency:  NA  HH Arranged:  PT HH Agency:  [facility] ([facility]  Status of Service:  Completed, signed off  If discussed at [employer] Meetings, dates discussed:    Additional Comments:  Elliot CousinShavis, Marquette Blodgett Ellen, RN 08/28/2016, 9:42 AM

## 2016-08-28 NOTE — Discharge Summary (Signed)
Patient ID: [name] MRN: [id number] DOB/AGE: March 09, 1961 56 y.o.  Admit date: 08/27/2016 Discharge date: 08/28/2016  Admission Diagnoses:  Principal Problem:   Unilateral primary osteoarthritis, right hip Active Problems:   Status post total replacement of right hip   Discharge Diagnoses:  Same  Past Medical History:  Diagnosis Date  . Arthritis   . Hypertension     Surgeries: Procedure(s): RIGHT TOTAL HIP ARTHROPLASTY ANTERIOR APPROACH on 08/27/2016   Consultants:   Discharged Condition: Improved  Hospital Course: [name] is an 56 y.o. female who was admitted 08/27/2016 for operative treatment ofUnilateral primary osteoarthritis, right hip. Patient has severe unremitting pain that affects sleep, daily activities, and work/hobbies. After pre-op clearance the patient was taken to the operating room on 08/27/2016 and underwent  Procedure(s): RIGHT TOTAL HIP ARTHROPLASTY ANTERIOR APPROACH.    Patient was given perioperative antibiotics: Anti-infectives    Start     Dose/Rate Route Frequency Ordered Stop   08/27/16 1400  ceFAZolin (ANCEF) IVPB 2g/100 mL premix     2 g 200 mL/hr over 30 Minutes Intravenous Every 6 hours 08/27/16 1143 08/27/16 2226   08/27/16 0600  ceFAZolin (ANCEF) 3 g in dextrose 5 % 50 mL IVPB     3 g 130 mL/hr over 30 Minutes Intravenous On call to O.R. 08/26/16 1314 08/27/16 0744       Patient was given sequential compression devices, early ambulation, and chemoprophylaxis to prevent DVT.  Patient benefited maximally from hospital stay and there were no complications.    Recent vital signs: Patient Vitals for the past 24 hrs:  BP Temp Temp src Pulse Resp SpO2  08/28/16 0603 139/74 98.5 F (36.9 C) Oral 68 16 99 %  08/28/16 0246 (!) 157/74 98.6 F (37 C) Oral 70 16 98 %  08/27/16 2154 (!) 141/75 99.2 F (37.3 C) Oral 82 16 96 %  08/27/16 1744 (!) 194/92 98.7 F (37.1 C) Oral 80 16 100 %  08/27/16 1359 (!) 185/91 98.9 F  (37.2 C) Oral 74 16 100 %  08/27/16 1246 (!) 188/89 98.2 F (36.8 C) Oral 70 - 100 %  08/27/16 1145 (!) 173/86 97.7 F (36.5 C) Oral 65 18 100 %  08/27/16 1057 (!) 142/76 97.5 F (36.4 C) - (!) 57 18 100 %  08/27/16 1030 129/82 97.5 F (36.4 C) - (!) 57 18 100 %  08/27/16 1015 134/80 - - (!) 56 16 100 %  08/27/16 1000 140/80 - - 61 16 100 %  08/27/16 0945 135/78 - - (!) 58 (!) 23 100 %     Recent laboratory studies:  Recent Labs  08/28/16 0418  WBC 7.6  HGB 12.4  HCT 36.4  PLT 197  NA 138  K 4.0  CL 105  CO2 26  BUN 9  CREATININE 0.84  GLUCOSE 117*  CALCIUM 8.6*     Discharge Medications:   Allergies as of 08/28/2016   No Known Allergies     Medication List    STOP taking these medications   ibuprofen 200 MG tablet Commonly known as:  ADVIL,MOTRIN     TAKE these medications   aspirin 81 MG chewable tablet Chew 1 tablet (81 mg total) by mouth 2 (two) times daily.   methocarbamol 500 MG tablet Commonly known as:  ROBAXIN Take 1 tablet (500 mg total) by mouth every 6 (six) hours as needed for muscle spasms.   oxyCODONE-acetaminophen 5-325 MG tablet Commonly known as:  ROXICET Take 1-2 tablets  by mouth every 4 (four) hours as needed.            Durable Medical Equipment        Start     Ordered   08/27/16 1144  DME 3 n 1  Once     08/27/16 1143   08/27/16 1144  DME Walker rolling  Once    Question:  Patient needs a walker to treat with the following condition  Answer:  Status post total replacement of right hip   08/27/16 1143      Diagnostic Studies: Dg Pelvis Portable  Result Date: 08/27/2016 CLINICAL DATA:  Right hip replacement EXAM: PORTABLE PELVIS 1-2 VIEWS COMPARISON:  None. FINDINGS: Changes of right hip replacement. Normal AP alignment. No visible hardware or bony complicating feature. Moderate to advanced degenerative changes in the left hip. IMPRESSION: Right hip replacement.  No visible complicating feature. Electronically Signed    By: Charlett Nose M.D.   On: 08/27/2016 09:52   Dg C-arm 1-60 Min-no Report  Result Date: 08/27/2016 Fluoroscopy was utilized by the requesting physician.  No radiographic interpretation.   Dg Hip Operative Unilat With Pelvis Right  Result Date: 08/27/2016 CLINICAL DATA:  Right total hip arthroplasty EXAM: OPERATIVE RIGHT HIP (WITH PELVIS IF PERFORMED) 2 VIEWS TECHNIQUE: Fluoroscopic spot image(s) were submitted for interpretation post-operatively. COMPARISON:  None. FINDINGS: Fluoroscopy time 0.5 minutes. 2 spot fluoroscopic nondiagnostic intraoperative right hip radiographs demonstrate expected postsurgical changes from right total hip arthroplasty, with no evidence of hip malalignment on these views. IMPRESSION: Intraoperative fluoroscopic guidance for right total hip arthroplasty. Electronically Signed   By: Delbert Phenix M.D.   On: 08/27/2016 09:11    Disposition: to home  Discharge Instructions    Discharge patient    Complete by:  As directed    Discharge disposition:  01-Home or Self Care   Discharge patient date:  08/28/2016      Follow-up Information    Kathryne Hitch, MD Follow up in 2 week(s).   Specialty:  Orthopedic Surgery Contact information: [address] [place] [address] [phone]            Signed: Kathryne Hitch 08/28/2016, 9:33 AM

## 2016-08-28 NOTE — Discharge Instructions (Signed)

## 2016-08-28 NOTE — Progress Notes (Signed)
Discharge instructions reviewed with patient utilizing teach back method no questions at this time. Patient discharged to home. ?

## 2016-08-28 NOTE — Progress Notes (Signed)
qPhysical Therapy Treatment Patient Details Name: [name] MRN: [id number] DOB: 1960-08-24 Today's Date: 08/28/2016    History of Present Illness Pt s/p R THR    PT Comments    Pt very motivated and progressing well.  Initiated therex program and reviewed car transfers and stairs.   Follow Up Recommendations  Home health PT     Equipment Recommendations  None recommended by PT    Recommendations for Other Services OT consult     Precautions / Restrictions Precautions Precautions: Fall Restrictions Weight Bearing Restrictions: No Other Position/Activity Restrictions: WBAT    Mobility  Bed Mobility Overal bed mobility: Needs Assistance Bed Mobility: Supine to Sit     Supine to sit: Supervision     General bed mobility comments: Pt self cueing  Transfers Overall transfer level: Needs assistance Equipment used: Rolling walker (2 wheeled) Transfers: Sit to/from Stand Sit to Stand: Supervision         General transfer comment: min cues for use of UEs to self assist  Ambulation/Gait Ambulation/Gait assistance: Min guard;Supervision Ambulation Distance (Feet): 250 Feet Assistive device: Rolling walker (2 wheeled) Gait Pattern/deviations: Step-to pattern;Step-through pattern;Decreased step length - right;Decreased step length - left;Shuffle;Trunk flexed Gait velocity: decr Gait velocity interpretation: Below normal speed for age/gender General Gait Details: cues for sequence and position from RW   Stairs Stairs: Yes   Stair Management: Two rails;Step to pattern;Forwards Number of Stairs: 5 General stair comments: cues for sequence and foot placement  Wheelchair Mobility    Modified Rankin (Stroke Patients Only)       Balance Overall balance assessment: No apparent balance deficits (not formally assessed)                                          Cognition Arousal/Alertness: Awake/alert Behavior During Therapy: WFL  for tasks assessed/performed Overall Cognitive Status: Within Functional Limits for tasks assessed                                        Exercises Total Joint Exercises Ankle Circles/Pumps: AROM;Both;15 reps;Supine Quad Sets: AROM;Both;10 reps;Supine Heel Slides: AAROM;Right;20 reps;Supine Hip ABduction/ADduction: AAROM;Right;20 reps;Supine    General Comments        Pertinent Vitals/Pain Pain Assessment: 0-10 Pain Score: 4  Pain Location: R hip Pain Descriptors / Indicators: Aching;Sore Pain Intervention(s): Limited activity within patient's tolerance;Monitored during session;Premedicated before session;Ice applied    Home Living                      Prior Function            PT Goals (current goals can now be found in the care plan section) Acute Rehab PT Goals Patient Stated Goal: Regain IND and walk without pain PT Goal Formulation: With patient Time For Goal Achievement: 08/31/16 Potential to Achieve Goals: Good Progress towards PT goals: Progressing toward goals    Frequency    7X/week      PT Plan Current plan remains appropriate    Co-evaluation             End of Session Equipment Utilized During Treatment: Gait belt Activity Tolerance: Patient tolerated treatment well Patient left: in chair;with call bell/phone within reach;with family/visitor present Nurse Communication: Mobility status PT Visit Diagnosis: Unsteadiness on  feet (R26.81)     Time: 1610-9604 PT Time Calculation (min) (ACUTE ONLY): 38 min  Charges:  $Gait Training: 8-22 mins $Therapeutic Exercise: 8-22 mins $Therapeutic Activity: 8-22 mins                    G Codes:       [id number]   [name] 08/28/2016, 11:32 AM

## 2016-08-28 NOTE — Progress Notes (Signed)
Subjective: 1 Day Post-Op Procedure(s) (LRB): RIGHT TOTAL HIP ARTHROPLASTY ANTERIOR APPROACH (Right) Patient reports pain as moderate.  Great mobility thus far.  Wants to go home today.  Objective: Vital signs in last 24 hours: Temp:  [97.5 F (36.4 C)-99.2 F (37.3 C)] 98.5 F (36.9 C) (03/24 0603) Pulse Rate:  [56-82] 68 (03/24 0603) Resp:  [16-23] 16 (03/24 0603) BP: (129-194)/(74-92) 139/74 (03/24 0603) SpO2:  [96 %-100 %] 99 % (03/24 0603)  Intake/Output from previous day: 03/23 0701 - 03/24 0700 In: 4323.8 [P.O.:580; I.V.:3643.8; IV Piggyback:100] Out: 2450 [Urine:2350; Blood:100] Intake/Output this shift: Total I/O In: -  Out: 150 [Urine:150]   Recent Labs  08/28/16 0418  HGB 12.4    Recent Labs  08/28/16 0418  WBC 7.6  RBC 4.43  HCT 36.4  PLT 197    Recent Labs  08/28/16 0418  NA 138  K 4.0  CL 105  CO2 26  BUN 9  CREATININE 0.84  GLUCOSE 117*  CALCIUM 8.6*   No results for input(s): LABPT, INR in the last 72 hours.  Sensation intact distally Intact pulses distally Dorsiflexion/Plantar flexion intact Incision: scant drainage  Assessment/Plan: 1 Day Post-Op Procedure(s) (LRB): RIGHT TOTAL HIP ARTHROPLASTY ANTERIOR APPROACH (Right) Up with therapy Discharge home with home health today.  Kathryne HitchChristopher Y Vihaan Gloss 08/28/2016, 9:31 AM

## 2016-08-30 ENCOUNTER — Telehealth (INDEPENDENT_AMBULATORY_CARE_PROVIDER_SITE_OTHER): Payer: Self-pay

## 2016-08-30 NOTE — Telephone Encounter (Signed)
See below

## 2016-08-30 NOTE — Telephone Encounter (Signed)
[facility] stating he contacted patient this morning about getting home health therapy set up after her total hip and patient declined, advising him that she did not need home health. He just wanted to make sure Dr Magnus IvanBlackman was aware.

## 2016-09-09 ENCOUNTER — Inpatient Hospital Stay (INDEPENDENT_AMBULATORY_CARE_PROVIDER_SITE_OTHER): Payer: BLUE CROSS/BLUE SHIELD | Admitting: Physician Assistant

## 2016-09-10 ENCOUNTER — Inpatient Hospital Stay (INDEPENDENT_AMBULATORY_CARE_PROVIDER_SITE_OTHER): Payer: BLUE CROSS/BLUE SHIELD | Admitting: Physician Assistant

## 2016-09-13 ENCOUNTER — Ambulatory Visit (INDEPENDENT_AMBULATORY_CARE_PROVIDER_SITE_OTHER): Payer: BLUE CROSS/BLUE SHIELD | Admitting: Physician Assistant

## 2016-09-13 ENCOUNTER — Encounter (INDEPENDENT_AMBULATORY_CARE_PROVIDER_SITE_OTHER): Payer: Self-pay | Admitting: Physician Assistant

## 2016-09-13 DIAGNOSIS — Z96641 Presence of right artificial hip joint: Secondary | ICD-10-CM

## 2016-09-13 MED ORDER — OXYCODONE-ACETAMINOPHEN 5-325 MG PO TABS: 1.0000 | ORAL_TABLET | ORAL | 0 refills | Status: DC | PRN

## 2016-09-13 NOTE — Progress Notes (Signed)
[name] returns today 2 weeks status post right total hip arthroplasty. She's overall doing well. She had no fevers chills shortness breath calf pain.  Physical exam :Right hip she has overall good range of motion of the hip without significant pain. Calf supple nontender. Surgical incisions healing well no signs of infection. . The wound incision is well approximated with staples.She does have a seroma.  Plan: Tables removed Steri-Strips applied. Scar tissue mobilization encouraged. She is encouraged to keep the top section of the incision dry. She may shower. Aspirin take 1 tablet once a day for another week and then after that stop aspirin as she was on prior to surgery . Continue to work on range of motion strengthening. We'll see her back in 1 month sooner if there is any questions or concerns. Hip seroma was aspirated after prep with Betadine and ethyl chloride. Total of 80 mL of blood-tinged aspirate was obtained.

## 2016-10-11 ENCOUNTER — Ambulatory Visit (INDEPENDENT_AMBULATORY_CARE_PROVIDER_SITE_OTHER): Payer: BLUE CROSS/BLUE SHIELD | Admitting: Orthopaedic Surgery

## 2016-10-11 DIAGNOSIS — Z96641 Presence of right artificial hip joint: Secondary | ICD-10-CM

## 2016-10-11 NOTE — Progress Notes (Signed)
The patient is now 6 weeks status post a right total hip replacement. She is making good progress. She's had a seroma drained before.  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  On examination her right hip moves well. Her incisions well-healed. There is abundant amount of adipose tissue around her hip and I try to drain any fluid from it but really I could not get anything from this area at all. Her knee does have valgus malalignment.  At this point she'll continue increase her activities. I'll see her back in 1 month. I would like a low AP pelvis at that visit as well as an AP and lateral standing of her right knee.

## 2016-11-05 NOTE — Addendum Note (Signed)
Addendum  created 11/05/16 1123 by Zalea Pete D, MD   Sign clinical note    

## 2016-11-08 ENCOUNTER — Ambulatory Visit (INDEPENDENT_AMBULATORY_CARE_PROVIDER_SITE_OTHER): Payer: Self-pay

## 2016-11-08 ENCOUNTER — Ambulatory Visit (INDEPENDENT_AMBULATORY_CARE_PROVIDER_SITE_OTHER): Payer: BLUE CROSS/BLUE SHIELD | Admitting: Orthopaedic Surgery

## 2016-11-08 DIAGNOSIS — M25561 Pain in right knee: Secondary | ICD-10-CM

## 2016-11-08 DIAGNOSIS — M7062 Trochanteric bursitis, left hip: Secondary | ICD-10-CM | POA: Diagnosis not present

## 2016-11-08 DIAGNOSIS — Z96641 Presence of right artificial hip joint: Secondary | ICD-10-CM | POA: Diagnosis not present

## 2016-11-08 MED ORDER — LIDOCAINE HCL 1 % IJ SOLN
3.0000 mL | INTRAMUSCULAR | Status: AC | PRN
  Administered 2016-11-08: 3 mL

## 2016-11-08 MED ORDER — METHYLPREDNISOLONE ACETATE 40 MG/ML IJ SUSP
40.0000 mg | INTRAMUSCULAR | Status: AC | PRN
  Administered 2016-11-08: 40 mg via INTRA_ARTICULAR

## 2016-11-08 NOTE — Progress Notes (Signed)
The patient is here in follow-up about 9 weeks status post a right total hip are placed. Shoulder right hip is doing well but we'll see her today also to evaluate her left hip and her right knee. Overall though she is making progress. She states that the left hip hurts and she points the trochanteric side of that hip as source for pain. She has a little bit of pain in the groin on that side. Should her knee is feeling a little bit better.  On examination she has excellent range of motion of both hips as well as her knee. All areas are tender but most tender over left trochanteric area.  X-rays of her right knee do show tricompartmental arthritic changes of the knee. X-rays of her pelvis show well-seated implant right side and arthritic changes on the left side.  For trochanteric bursitis of left Syme over try stretching exercises and I offered her injection which he agreed upon. She tolerated the injection well. I'll see her back in 4 weeks see how she

## 2016-11-08 NOTE — Progress Notes (Signed)
   Procedure Note  Patient: [name]             Date of Birth: Mar 05, 1961           MRN: [id number]             Visit Date: 11/08/2016  Procedures: Visit Diagnoses: Status post right hip replacement - Plan: XR Pelvis 1-2 Views  Right knee pain, unspecified chronicity - Plan: XR Knee 1-2 Views Right  Trochanteric bursitis, left hip  Large Joint Inj Date/Time: 11/08/2016 2:47 PM Performed by: Kathryne HitchBLACKMAN, CHRISTOPHER Y Authorized by: Kathryne HitchBLACKMAN, CHRISTOPHER Y   Location:  Hip Site:  L greater trochanter Ultrasound Guidance: No   Fluoroscopic Guidance: No   Arthrogram: No   Medications:  3 mL lidocaine 1 %; 40 mg methylPREDNISolone acetate 40 MG/ML

## 2016-12-20 ENCOUNTER — Encounter (INDEPENDENT_AMBULATORY_CARE_PROVIDER_SITE_OTHER): Payer: Self-pay | Admitting: Orthopaedic Surgery

## 2016-12-20 ENCOUNTER — Ambulatory Visit (INDEPENDENT_AMBULATORY_CARE_PROVIDER_SITE_OTHER): Payer: BLUE CROSS/BLUE SHIELD | Admitting: Orthopaedic Surgery

## 2016-12-20 DIAGNOSIS — M1612 Unilateral primary osteoarthritis, left hip: Secondary | ICD-10-CM

## 2016-12-20 DIAGNOSIS — M25552 Pain in left hip: Secondary | ICD-10-CM | POA: Diagnosis not present

## 2016-12-20 NOTE — Progress Notes (Signed)
The patient is well-known to me. She is coming up to 4 months status post a right total hip arthroplasty. Her left hip isn't bothering her quite a bit. Her pain is mainly in the groin. We did try trochanteric injection and said that did not help at all. Her pain is slowly worsening and is starting to detrimentally affected her activities daily living, her quality of life, her mobility. She feels like she is making progress with her right total hip arthroplasty that again we replaced in March.  On examination of her right hip moves fluidly actively and passively. On examination of her left hip she has pain at extremes of rotation. I did review x-rays from last month of her hip and pelvis showing both hips. She does have a well-seated implant on the right side but the left side does show significant superior lateral joint space narrowing with para-articular osteophytes and sclerotic changes. We discussed these findings in detail. She does wish to try an intra-articular steroid injection under direct fluoroscopy by Dr. Alvester MorinNewton. I think this is reasonable as well. We'll work on setting that injection. I will see her back myself in 4 weeks and hopefully in the interim she'll have injection and we can see how she is done with that.

## 2016-12-20 NOTE — Addendum Note (Signed)
Addended byPrescott Parma: Justyce Yeater on: 12/20/2016 09:17 AM   Modules accepted: Orders

## 2017-01-03 ENCOUNTER — Ambulatory Visit (INDEPENDENT_AMBULATORY_CARE_PROVIDER_SITE_OTHER): Payer: BLUE CROSS/BLUE SHIELD

## 2017-01-03 ENCOUNTER — Ambulatory Visit (INDEPENDENT_AMBULATORY_CARE_PROVIDER_SITE_OTHER): Payer: BLUE CROSS/BLUE SHIELD | Admitting: Physical Medicine and Rehabilitation

## 2017-01-03 ENCOUNTER — Encounter (INDEPENDENT_AMBULATORY_CARE_PROVIDER_SITE_OTHER): Payer: Self-pay | Admitting: Physical Medicine and Rehabilitation

## 2017-01-03 DIAGNOSIS — M25552 Pain in left hip: Secondary | ICD-10-CM | POA: Diagnosis not present

## 2017-01-03 NOTE — Progress Notes (Signed)
Left hip and groin pain for several months. Constant pain. Leg gives out at times. Difficulty getting moving after sitting a long time and goes to stand.

## 2017-01-03 NOTE — Patient Instructions (Signed)

## 2017-01-03 NOTE — Progress Notes (Unsigned)
Fluoro Time: 18sec  MGY: 16.39

## 2017-01-03 NOTE — Progress Notes (Signed)
[name] - 56 y.o. female MRN [id number]  Date of birth: 05/29/1961  Office Visit Note: Visit Date: 01/03/2017 PCP: Maxie Betterousins, Sheronette, MD Referred by: No ref. provider found  Subjective: Chief Complaint  Patient presents with  . Left Hip - Pain   HPI: [name] is a 56 year old female with prior right total hip arthroplasty and he sees Dr. Magnus IvanBlackman for her orthopedic care. She has been having left hip and groin pain and he suggested diagnostic of a therapeutic left hip anesthetic arthrogram.    ROS Otherwise per HPI.  Assessment & Plan: Visit Diagnoses:  1. Pain in left hip     Plan: Findings:  Left hip diagnostic and therapeutic anesthetic hip arthrogram. Patient did have relief during the anesthetic phase.    Meds & Orders: No orders of the defined types were placed in this encounter.   Orders Placed This Encounter  Procedures  . Large Joint Injection/Arthrocentesis  . XR C-ARM NO REPORT    Follow-up: Return for Dr. Magnus IvanBlackman as scheduled.   Procedures: Large Joint Inj Date/Time: 01/03/2017 9:53 AM Performed by: Tyrell AntonioNEWTON, Annaliza Zia Authorized by: Tyrell AntonioNEWTON, Bedelia Pong   Consent Given by:  Patient Site marked: the procedure site was marked   Timeout: prior to procedure the correct patient, procedure, and site was verified   Indications:  Pain and diagnostic evaluation Location:  Hip Site:  L hip joint Prep: patient was prepped and draped in usual sterile fashion   Needle Size:  22 G Approach:  Anterior Ultrasound Guidance: No   Fluoroscopic Guidance: No   Arthrogram: Yes   Medications:  3 mL bupivacaine 0.5 %; 80 mg triamcinolone acetonide 40 MG/ML Aspiration Attempted: Yes   Patient tolerance:  Patient tolerated the procedure well with no immediate complications  Arthrogram demonstrated excellent flow of contrast throughout the joint surface without extravasation or obvious defect.  The patient had relief of symptoms during the anesthetic phase of the  injection.      No notes on file   Clinical History: No specialty comments available.  She reports that she has never smoked. She has never used smokeless tobacco. No results for input(s): HGBA1C, LABURIC in the last 8760 hours.  Objective:  VS:  HT:    WT:   BMI:     BP:   HR: bpm  TEMP: ( )  RESP:  Physical Exam  Musculoskeletal:  Patient does have pain with hip rotation on the left.    Ortho Exam Imaging: Xr C-arm No Report  Result Date: 01/03/2017 Please see Notes or Procedures tab for imaging impression.   Past Medical/Family/Surgical/Social History: Medications & Allergies reviewed per EMR Patient Active Problem List   Diagnosis Date Noted  . Trochanteric bursitis, left hip 11/08/2016  . Status post right hip replacement 11/08/2016  . Right knee pain 11/08/2016  . Unilateral primary osteoarthritis, right hip 08/27/2016  . Status post total replacement of right hip 08/27/2016   Past Medical History:  Diagnosis Date  . Arthritis   . Hypertension    Family History  Problem Relation Age of Onset  . Diabetes Father   . Hypertension Father   . Diabetes Mother   . Hypertension Brother    Past Surgical History:  Procedure Laterality Date  . BREAST LUMPECTOMY     unaware which breast   . TOTAL HIP ARTHROPLASTY Right 08/27/2016   Procedure: RIGHT TOTAL HIP ARTHROPLASTY ANTERIOR APPROACH;  Surgeon: Kathryne Hitchhristopher Y Blackman, MD;  Location: [facility];  Service: Orthopedics;  Laterality: Right;   Social History   Occupational History  . Not on file.   Social History Main Topics  . Smoking status: Never Smoker  . Smokeless tobacco: Never Used  . Alcohol use Yes     Comment: occasionally   . Drug use: No  . Sexual activity: No

## 2017-01-04 MED ORDER — BUPIVACAINE HCL 0.5 % IJ SOLN
3.0000 mL | INTRAMUSCULAR | Status: AC | PRN
Start: 2017-01-03 — End: 2017-01-03
  Administered 2017-01-03: 3 mL via INTRA_ARTICULAR

## 2017-01-04 MED ORDER — TRIAMCINOLONE ACETONIDE 40 MG/ML IJ SUSP
80.0000 mg | INTRAMUSCULAR | Status: AC | PRN
  Administered 2017-01-03: 80 mg via INTRA_ARTICULAR

## 2017-01-17 ENCOUNTER — Ambulatory Visit (INDEPENDENT_AMBULATORY_CARE_PROVIDER_SITE_OTHER): Payer: BLUE CROSS/BLUE SHIELD | Admitting: Orthopaedic Surgery

## 2017-01-17 DIAGNOSIS — M25552 Pain in left hip: Secondary | ICD-10-CM | POA: Insufficient documentation

## 2017-01-17 DIAGNOSIS — M1612 Unilateral primary osteoarthritis, left hip: Secondary | ICD-10-CM | POA: Diagnosis not present

## 2017-01-17 DIAGNOSIS — Z96641 Presence of right artificial hip joint: Secondary | ICD-10-CM | POA: Diagnosis not present

## 2017-01-17 NOTE — Progress Notes (Signed)
The patient is well-known to me. She has 5 months status post a right total hip arthroplasty was done great for her. Her left hip does show significant arthritic changes. She's tried trochanteric steroid injection as well as intra-articular steroid injection. She still having a lot of groin pain and locking in that hip and is detrimentally affected her activities daily living, her quality of life, and her mobility. Her pain is daily. It is 10 out of 10. It this point she does wish to proceed with a total hip arthroplasty on the left side having done so on the right side. She like to have this done sometime in late October.  On exam her right hip moves fluidly. Her left hip has pain with internal/external rotation.  At this point I agree with proceeding with a hip replacement later in the year. She is tried and failed all forms conservative treatment and understands fully the risk medicine surgery. All questions were encouraged and answered. She does have our surgery scheduler's card as well for being set up for surgery for later in October.

## 2017-01-24 ENCOUNTER — Telehealth (INDEPENDENT_AMBULATORY_CARE_PROVIDER_SITE_OTHER): Payer: Self-pay | Admitting: *Deleted

## 2017-01-24 NOTE — Telephone Encounter (Signed)
Pt called to schedule surgrey, did not want to be transferred to [facility]. Stated she needed to talk to someone. I advised pt that Sherrie was in with a  Patient and would call back at her earliest time.

## 2017-01-25 NOTE — Telephone Encounter (Signed)
Spoke with pt yesterday and scheduled surgery.

## 2017-03-18 NOTE — Progress Notes (Signed)
Please place orders in EPIC as patient is being scheduled for a pre-op appointment! Thank you! 

## 2017-03-21 ENCOUNTER — Other Ambulatory Visit (INDEPENDENT_AMBULATORY_CARE_PROVIDER_SITE_OTHER): Payer: Self-pay

## 2017-03-22 NOTE — Progress Notes (Signed)
EKG 08-20-16 epic

## 2017-03-22 NOTE — Progress Notes (Signed)
Please place orders in EPIC as patient has a pre-op appointment on 03/25/2017! Thank you! 

## 2017-03-22 NOTE — Patient Instructions (Addendum)
[name]  03/22/2017   Your procedure is scheduled on: 04-01-17  Report to [facility] Main  Entrance   Follow signs to Short Stay on first floor at 515AM   Call this number if you have problems the morning of surgery  [phone]   Remember: ONLY 1 PERSON MAY GO WITH YOU TO SHORT STAY TO GET  READY MORNING OF YOUR SURGERY.  Do not eat food or drink liquids :After Midnight.     Take these medicines the morning of surgery with A SIP OF WATER: NONE                                You may not have any metal on your body including hair pins and              piercings  Do not wear jewelry, make-up, lotions, powders or perfumes, deodorant             Do not wear nail polish.  Do not shave  48 hours prior to surgery.               Do not bring valuables to the hospital. [facility] IS NOT             RESPONSIBLE   FOR VALUABLES.  Contacts, dentures or bridgework may not be worn into surgery.  Leave suitcase in the car. After surgery it may be brought to your room.                Please read over the following fact sheets you were given: _____________________________________________________________________            [facility] - Preparing for Surgery Before surgery, you can play an important role.  Because skin is not sterile, your skin needs to be as free of germs as possible.  You can reduce the number of germs on your skin by washing with CHG (chlorahexidine gluconate) soap before surgery.  CHG is an antiseptic cleaner which kills germs and bonds with the skin to continue killing germs even after washing. Please DO NOT use if you have an allergy to CHG or antibacterial soaps.  If your skin becomes reddened/irritated stop using the CHG and inform your nurse when you arrive at Short Stay. Do not shave (including legs and underarms) for at least 48 hours prior to the first CHG shower.  You may shave your face/neck. Please follow these instructions  carefully:  1.  Shower with CHG Soap the night before surgery and the  morning of Surgery.  2.  If you choose to wash your hair, wash your hair first as usual with your  normal  shampoo.  3.  After you shampoo, rinse your hair and body thoroughly to remove the  shampoo.                           4.  Use CHG as you would any other liquid soap.  You can apply chg directly  to the skin and wash                       Gently with a scrungie or clean washcloth.  5.  Apply the CHG Soap to your body ONLY FROM THE NECK DOWN.  Do not use on face/ open                           Wound or open sores. Avoid contact with eyes, ears mouth and genitals (private parts).                       Wash face,  Genitals (private parts) with your normal soap.             6.  Wash thoroughly, paying special attention to the area where your surgery  will be performed.  7.  Thoroughly rinse your body with warm water from the neck down.  8.  DO NOT shower/wash with your normal soap after using and rinsing off  the CHG Soap.                9.  Pat yourself dry with a clean towel.            10.  Wear clean pajamas.            11.  Place clean sheets on your bed the night of your first shower and do not  sleep with pets. Day of Surgery : Do not apply any lotions/deodorants the morning of surgery.  Please wear clean clothes to the hospital/surgery center.  FAILURE TO FOLLOW THESE INSTRUCTIONS MAY RESULT IN THE CANCELLATION OF YOUR SURGERY PATIENT SIGNATURE_________________________________  NURSE SIGNATURE__________________________________  ________________________________________________________________________   [name]  An incentive spirometer is a tool that can help keep your lungs clear and active. This tool measures how well you are filling your lungs with each breath. Taking long deep breaths may help reverse or decrease the chance of developing breathing (pulmonary) problems (especially infection)  following:  A long period of time when you are unable to move or be active. BEFORE THE PROCEDURE   If the spirometer includes an indicator to show your best effort, your nurse or respiratory therapist will set it to a desired goal.  If possible, sit up straight or lean slightly forward. Try not to slouch.  Hold the incentive spirometer in an upright position. INSTRUCTIONS FOR USE  1. Sit on the edge of your bed if possible, or sit up as far as you can in bed or on a chair. 2. Hold the incentive spirometer in an upright position. 3. Breathe out normally. 4. Place the mouthpiece in your mouth and seal your lips tightly around it. 5. Breathe in slowly and as deeply as possible, raising the piston or the ball toward the top of the column. 6. Hold your breath for 3-5 seconds or for as long as possible. Allow the piston or ball to fall to the bottom of the column. 7. Remove the mouthpiece from your mouth and breathe out normally. 8. Rest for a few seconds and repeat Steps 1 through 7 at least 10 times every 1-2 hours when you are awake. Take your time and take a few normal breaths between deep breaths. 9. The spirometer may include an indicator to show your best effort. Use the indicator as a goal to work toward during each repetition. 10. After each set of 10 deep breaths, practice coughing to be sure your lungs are clear. If you have an incision (the cut made at the time of surgery), support your incision when coughing by placing a pillow or rolled up towels firmly against it. Once you are able to get out of  bed, walk around indoors and cough well. You may stop using the incentive spirometer when instructed by your caregiver.  RISKS AND COMPLICATIONS  Take your time so you do not get dizzy or light-headed.  If you are in pain, you may need to take or ask for pain medication before doing incentive spirometry. It is harder to take a deep breath if you are having pain. AFTER USE  Rest and  breathe slowly and easily.  It can be helpful to keep track of a log of your progress. Your caregiver can provide you with a simple table to help with this. If you are using the spirometer at home, follow these instructions: [place] IF:   You are having difficultly using the spirometer.  You have trouble using the spirometer as often as instructed.  Your pain medication is not giving enough relief while using the spirometer.  You develop fever of 100.5 F (38.1 C) or higher. SEEK IMMEDIATE MEDICAL CARE IF:   You cough up bloody sputum that had not been present before.  You develop fever of 102 F (38.9 C) or greater.  You develop worsening pain at or near the incision site. MAKE SURE YOU:   Understand these instructions.  Will watch your condition.  Will get help right away if you are not doing well or get worse. Document Released: 10/04/2006 Document Revised: 08/16/2011 Document Reviewed: 12/05/2006 [facility] Patient Information 2014 [place], [place].   ________________________________________________________________________

## 2017-03-23 NOTE — Progress Notes (Signed)
Please place orders in EPIC as patient has a pre-op appointment on 03/25/2017! Thank you! 

## 2017-03-24 ENCOUNTER — Other Ambulatory Visit (INDEPENDENT_AMBULATORY_CARE_PROVIDER_SITE_OTHER): Payer: Self-pay | Admitting: Physician Assistant

## 2017-03-25 ENCOUNTER — Encounter (HOSPITAL_COMMUNITY)
Admission: RE | Admit: 2017-03-25 | Discharge: 2017-03-25 | Disposition: A | Discharge status: Home / Self Care | Payer: BLUE CROSS/BLUE SHIELD | Source: Ambulatory Visit | Attending: Orthopaedic Surgery | Admitting: Orthopaedic Surgery

## 2017-03-25 ENCOUNTER — Encounter (HOSPITAL_COMMUNITY): Payer: Self-pay

## 2017-03-25 DIAGNOSIS — Z01818 Encounter for other preprocedural examination: Secondary | ICD-10-CM | POA: Diagnosis present

## 2017-03-25 DIAGNOSIS — M1612 Unilateral primary osteoarthritis, left hip: Secondary | ICD-10-CM | POA: Insufficient documentation

## 2017-03-25 LAB — CBC
HEMATOCRIT: 42.5 % (ref 36.0–46.0)
HEMOGLOBIN: 14.6 g/dL (ref 12.0–15.0)
MCH: 28.8 pg (ref 26.0–34.0)
MCHC: 34.4 g/dL (ref 30.0–36.0)
MCV: 83.8 fL (ref 78.0–100.0)
Platelets: 174 10*3/uL (ref 150–400)
RBC: 5.07 MIL/uL (ref 3.87–5.11)
RDW: 14.3 % (ref 11.5–15.5)
WBC: 6.2 10*3/uL (ref 4.0–10.5)

## 2017-03-25 LAB — BASIC METABOLIC PANEL
ANION GAP: 10 (ref 5–15)
BUN: 16 mg/dL (ref 6–20)
CALCIUM: 8.8 mg/dL — AB (ref 8.9–10.3)
CHLORIDE: 107 mmol/L (ref 101–111)
CO2: 24 mmol/L (ref 22–32)
Creatinine, Ser: 0.8 mg/dL (ref 0.44–1.00)
GFR calc Af Amer: >60 mL/min (ref >60)
GFR calc non Af Amer: >60 mL/min (ref >60)
GLUCOSE: 82 mg/dL (ref 65–99)
Potassium: 4.1 mmol/L (ref 3.5–5.1)
Sodium: 141 mmol/L (ref 135–145)

## 2017-03-25 LAB — SURGICAL PCR SCREEN
MRSA, PCR: NEGATIVE
STAPHYLOCOCCUS AUREUS: NEGATIVE

## 2017-03-31 MED ORDER — DEXTROSE 5 % IV SOLN
3.0000 g | INTRAVENOUS | Status: AC
  Administered 2017-04-01: 3 g via INTRAVENOUS
  Filled 2017-03-31 (×2): qty 3000

## 2017-03-31 NOTE — Anesthesia Preprocedure Evaluation (Addendum)
Anesthesia Evaluation  Patient identified by MRN, date of birth, ID band Patient awake    Reviewed: Allergy & Precautions, NPO status , Patient's Chart, lab work & pertinent test results  Airway Mallampati: II  TM Distance: >3 FB Neck ROM: Full    Dental  (+) Teeth Intact, Dental Advisory Given   Pulmonary neg pulmonary ROS,    Pulmonary exam normal breath sounds clear to auscultation       Cardiovascular hypertension, Normal cardiovascular exam Rhythm:Regular Rate:Normal     Neuro/Psych negative neurological ROS     GI/Hepatic negative GI ROS, Neg liver ROS,   Endo/Other  Morbid obesity  Renal/GU negative Renal ROS     Musculoskeletal  (+) Arthritis , Osteoarthritis,    Abdominal   Peds  Hematology negative hematology ROS (+) Plt 174k   Anesthesia Other Findings Day of surgery medications reviewed with the patient.  Reproductive/Obstetrics                           Anesthesia Physical Anesthesia Plan  ASA: III  Anesthesia Plan: Spinal   Post-op Pain Management:    Induction: Intravenous  PONV Risk Score and Plan: 2 and Ondansetron and Dexamethasone  Airway Management Planned: Simple Face Mask  Additional Equipment:   Intra-op Plan:   Post-operative Plan:   Informed Consent: I have reviewed the patients History and Physical, chart, labs and discussed the procedure including the risks, benefits and alternatives for the proposed anesthesia with the patient or authorized representative who has indicated his/her understanding and acceptance.   Dental advisory given  Plan Discussed with: Anesthesiologist and Surgeon  Anesthesia Plan Comments: (Discussed risks and benefits of and differences between spinal and general. Discussed risks of spinal including headache, backache, failure, bleeding, infection, and nerve damage. Patient consents to spinal. Questions answered. Coagulation  studies and platelet count acceptable.)       Anesthesia Quick Evaluation

## 2017-04-01 ENCOUNTER — Inpatient Hospital Stay (HOSPITAL_COMMUNITY)
Admission: RE | Admit: 2017-04-01 | Discharge: 2017-04-02 | DRG: 470 | Disposition: A | Discharge status: Home Health | Payer: BLUE CROSS/BLUE SHIELD | Source: Ambulatory Visit | Attending: Orthopaedic Surgery | Admitting: Orthopaedic Surgery

## 2017-04-01 ENCOUNTER — Encounter (HOSPITAL_COMMUNITY): Payer: Self-pay

## 2017-04-01 ENCOUNTER — Inpatient Hospital Stay (HOSPITAL_COMMUNITY): Payer: BLUE CROSS/BLUE SHIELD

## 2017-04-01 ENCOUNTER — Inpatient Hospital Stay (HOSPITAL_COMMUNITY): Payer: BLUE CROSS/BLUE SHIELD | Admitting: Anesthesiology

## 2017-04-01 ENCOUNTER — Encounter (HOSPITAL_COMMUNITY)
Admission: RE | Disposition: A | Discharge status: Home Health | Payer: Self-pay | Source: Ambulatory Visit | Attending: Orthopaedic Surgery

## 2017-04-01 DIAGNOSIS — M1612 Unilateral primary osteoarthritis, left hip: Secondary | ICD-10-CM | POA: Diagnosis not present

## 2017-04-01 DIAGNOSIS — I10 Essential (primary) hypertension: Secondary | ICD-10-CM | POA: Diagnosis present

## 2017-04-01 DIAGNOSIS — Z96641 Presence of right artificial hip joint: Secondary | ICD-10-CM | POA: Diagnosis present

## 2017-04-01 DIAGNOSIS — M25552 Pain in left hip: Secondary | ICD-10-CM | POA: Diagnosis present

## 2017-04-01 DIAGNOSIS — Z6841 Body Mass Index (BMI) 40.0 and over, adult: Secondary | ICD-10-CM | POA: Diagnosis not present

## 2017-04-01 DIAGNOSIS — Z96642 Presence of left artificial hip joint: Secondary | ICD-10-CM

## 2017-04-01 DIAGNOSIS — Z8249 Family history of ischemic heart disease and other diseases of the circulatory system: Secondary | ICD-10-CM | POA: Diagnosis not present

## 2017-04-01 DIAGNOSIS — Z419 Encounter for procedure for purposes other than remedying health state, unspecified: Secondary | ICD-10-CM

## 2017-04-01 HISTORY — PX: TOTAL HIP ARTHROPLASTY: SHX124

## 2017-04-01 LAB — TYPE AND SCREEN
ABO/RH(D): O POS
Antibody Screen: NEGATIVE

## 2017-04-01 SURGERY — ARTHROPLASTY, HIP, TOTAL, ANTERIOR APPROACH
Anesthesia: Spinal | Site: Hip | Laterality: Left

## 2017-04-01 MED ORDER — ACETAMINOPHEN 500 MG PO TABS
1000.0000 mg | ORAL_TABLET | Freq: Once | ORAL | Status: AC
  Administered 2017-04-01: 1000 mg via ORAL

## 2017-04-01 MED ORDER — EPHEDRINE 5 MG/ML INJ
INTRAVENOUS | Status: AC
  Filled 2017-04-01: qty 10

## 2017-04-01 MED ORDER — HYDROCODONE-ACETAMINOPHEN 7.5-325 MG PO TABS
1.0000 | ORAL_TABLET | ORAL | Status: DC | PRN
  Administered 2017-04-01 – 2017-04-02 (×5): 1 via ORAL
  Filled 2017-04-01 (×5): qty 1

## 2017-04-01 MED ORDER — ACETAMINOPHEN 650 MG RE SUPP: 650.0000 mg | RECTAL | Status: DC | PRN

## 2017-04-01 MED ORDER — ASPIRIN 81 MG PO CHEW
81.0000 mg | CHEWABLE_TABLET | Freq: Two times a day (BID) | ORAL | Status: DC
  Administered 2017-04-01 – 2017-04-02 (×2): 81 mg via ORAL
  Filled 2017-04-01 (×2): qty 1

## 2017-04-01 MED ORDER — CELECOXIB 200 MG PO CAPS
ORAL_CAPSULE | ORAL | Status: AC
  Filled 2017-04-01: qty 1

## 2017-04-01 MED ORDER — DEXTROSE 5 % IV SOLN
500.0000 mg | Freq: Four times a day (QID) | INTRAVENOUS | Status: DC | PRN
  Administered 2017-04-01: 500 mg via INTRAVENOUS
  Filled 2017-04-01: qty 550

## 2017-04-01 MED ORDER — POLYETHYLENE GLYCOL 3350 17 G PO PACK: 17.0000 g | PACK | Freq: Every day | ORAL | Status: DC | PRN

## 2017-04-01 MED ORDER — PROPOFOL 10 MG/ML IV BOLUS
INTRAVENOUS | Status: AC
  Filled 2017-04-01: qty 20

## 2017-04-01 MED ORDER — GABAPENTIN 100 MG PO CAPS
100.0000 mg | ORAL_CAPSULE | Freq: Three times a day (TID) | ORAL | Status: DC
  Administered 2017-04-01 – 2017-04-02 (×3): 100 mg via ORAL
  Filled 2017-04-01 (×3): qty 1

## 2017-04-01 MED ORDER — GABAPENTIN 300 MG PO CAPS
ORAL_CAPSULE | ORAL | Status: AC
  Filled 2017-04-01: qty 1

## 2017-04-01 MED ORDER — ONDANSETRON HCL 4 MG/2ML IJ SOLN
INTRAMUSCULAR | Status: DC | PRN
  Administered 2017-04-01: 4 mg via INTRAVENOUS

## 2017-04-01 MED ORDER — PHENOL 1.4 % MT LIQD: 1.0000 | OROMUCOSAL | Status: DC | PRN

## 2017-04-01 MED ORDER — ACETAMINOPHEN 500 MG PO TABS
ORAL_TABLET | ORAL | Status: AC
  Filled 2017-04-01: qty 2

## 2017-04-01 MED ORDER — ALUM & MAG HYDROXIDE-SIMETH 200-200-20 MG/5ML PO SUSP: 30.0000 mL | ORAL | Status: DC | PRN

## 2017-04-01 MED ORDER — SODIUM CHLORIDE 0.9 % IR SOLN
Status: DC | PRN
Start: 2017-04-01 — End: 2017-04-01
  Administered 2017-04-01: 1000 mL

## 2017-04-01 MED ORDER — LACTATED RINGERS IV SOLN
INTRAVENOUS | Status: DC
  Administered 2017-04-01: 09:00:00 via INTRAVENOUS
  Administered 2017-04-01: 1000 mL via INTRAVENOUS
  Administered 2017-04-01: 09:00:00 via INTRAVENOUS

## 2017-04-01 MED ORDER — HYDROMORPHONE HCL 1 MG/ML IJ SOLN
1.0000 mg | INTRAMUSCULAR | Status: DC | PRN
  Administered 2017-04-01: 1 mg via INTRAVENOUS
  Filled 2017-04-01: qty 1

## 2017-04-01 MED ORDER — EPHEDRINE SULFATE-NACL 50-0.9 MG/10ML-% IV SOSY
PREFILLED_SYRINGE | INTRAVENOUS | Status: DC | PRN
  Administered 2017-04-01 (×2): 10 mg via INTRAVENOUS

## 2017-04-01 MED ORDER — BUPIVACAINE IN DEXTROSE 0.75-8.25 % IT SOLN
INTRATHECAL | Status: DC | PRN
  Administered 2017-04-01: 2 mL via INTRATHECAL

## 2017-04-01 MED ORDER — MIDAZOLAM HCL 5 MG/5ML IJ SOLN
INTRAMUSCULAR | Status: DC | PRN
  Administered 2017-04-01: 2 mg via INTRAVENOUS

## 2017-04-01 MED ORDER — ONDANSETRON HCL 4 MG/2ML IJ SOLN: 4.0000 mg | Freq: Once | INTRAMUSCULAR | Status: DC | PRN

## 2017-04-01 MED ORDER — METOCLOPRAMIDE HCL 5 MG PO TABS: 5.0000 mg | ORAL_TABLET | Freq: Three times a day (TID) | ORAL | Status: DC | PRN

## 2017-04-01 MED ORDER — SODIUM CHLORIDE 0.9 % IR SOLN
Status: DC | PRN
  Administered 2017-04-01: 1000 mL

## 2017-04-01 MED ORDER — GABAPENTIN 300 MG PO CAPS
300.0000 mg | ORAL_CAPSULE | Freq: Once | ORAL | Status: AC
  Administered 2017-04-01: 300 mg via ORAL

## 2017-04-01 MED ORDER — METOCLOPRAMIDE HCL 5 MG/ML IJ SOLN: 5.0000 mg | Freq: Three times a day (TID) | INTRAMUSCULAR | Status: DC | PRN

## 2017-04-01 MED ORDER — FENTANYL CITRATE (PF) 100 MCG/2ML IJ SOLN
INTRAMUSCULAR | Status: DC | PRN
  Administered 2017-04-01: 100 ug via INTRAVENOUS

## 2017-04-01 MED ORDER — LIDOCAINE 2% (20 MG/ML) 5 ML SYRINGE
INTRAMUSCULAR | Status: DC | PRN
  Administered 2017-04-01: 50 mg via INTRAVENOUS

## 2017-04-01 MED ORDER — STERILE WATER FOR IRRIGATION IR SOLN
Status: DC | PRN
  Administered 2017-04-01: 2000 mL

## 2017-04-01 MED ORDER — CEFAZOLIN SODIUM-DEXTROSE 2-4 GM/100ML-% IV SOLN
2.0000 g | Freq: Four times a day (QID) | INTRAVENOUS | Status: AC
  Administered 2017-04-01 (×2): 2 g via INTRAVENOUS
  Filled 2017-04-01 (×2): qty 100

## 2017-04-01 MED ORDER — OXYCODONE HCL 5 MG PO TABS: 10.0000 mg | ORAL_TABLET | ORAL | Status: DC | PRN

## 2017-04-01 MED ORDER — LIDOCAINE 2% (20 MG/ML) 5 ML SYRINGE
INTRAMUSCULAR | Status: AC
  Filled 2017-04-01: qty 5

## 2017-04-01 MED ORDER — FENTANYL CITRATE (PF) 100 MCG/2ML IJ SOLN
INTRAMUSCULAR | Status: AC
  Filled 2017-04-01: qty 2

## 2017-04-01 MED ORDER — ACETAMINOPHEN 325 MG PO TABS: 650.0000 mg | ORAL_TABLET | ORAL | Status: DC | PRN

## 2017-04-01 MED ORDER — PROPOFOL 500 MG/50ML IV EMUL
INTRAVENOUS | Status: DC | PRN
  Administered 2017-04-01: 50 ug/kg/min via INTRAVENOUS

## 2017-04-01 MED ORDER — DEXAMETHASONE SODIUM PHOSPHATE 10 MG/ML IJ SOLN
INTRAMUSCULAR | Status: DC | PRN
  Administered 2017-04-01: 10 mg via INTRAVENOUS

## 2017-04-01 MED ORDER — TRANEXAMIC ACID 1000 MG/10ML IV SOLN
1000.0000 mg | INTRAVENOUS | Status: AC
  Administered 2017-04-01: 1000 mg via INTRAVENOUS
  Filled 2017-04-01: qty 1100

## 2017-04-01 MED ORDER — ONDANSETRON HCL 4 MG/2ML IJ SOLN: 4.0000 mg | Freq: Four times a day (QID) | INTRAMUSCULAR | Status: DC | PRN

## 2017-04-01 MED ORDER — METHOCARBAMOL 500 MG PO TABS
500.0000 mg | ORAL_TABLET | Freq: Four times a day (QID) | ORAL | Status: DC | PRN
  Administered 2017-04-01 – 2017-04-02 (×2): 500 mg via ORAL
  Filled 2017-04-01 (×2): qty 1

## 2017-04-01 MED ORDER — ONDANSETRON HCL 4 MG PO TABS: 4.0000 mg | ORAL_TABLET | Freq: Four times a day (QID) | ORAL | Status: DC | PRN

## 2017-04-01 MED ORDER — CELECOXIB 200 MG PO CAPS
200.0000 mg | ORAL_CAPSULE | Freq: Once | ORAL | Status: AC
  Administered 2017-04-01: 200 mg via ORAL

## 2017-04-01 MED ORDER — DOCUSATE SODIUM 100 MG PO CAPS
100.0000 mg | ORAL_CAPSULE | Freq: Two times a day (BID) | ORAL | Status: DC
  Administered 2017-04-01 – 2017-04-02 (×2): 100 mg via ORAL
  Filled 2017-04-01 (×2): qty 1

## 2017-04-01 MED ORDER — CHLORHEXIDINE GLUCONATE 4 % EX LIQD: 60.0000 mL | Freq: Once | CUTANEOUS | Status: DC

## 2017-04-01 MED ORDER — MENTHOL 3 MG MT LOZG: 1.0000 | LOZENGE | OROMUCOSAL | Status: DC | PRN

## 2017-04-01 MED ORDER — SODIUM CHLORIDE 0.9 % IV SOLN
INTRAVENOUS | Status: DC
  Administered 2017-04-01: 18:00:00 via INTRAVENOUS

## 2017-04-01 MED ORDER — DIPHENHYDRAMINE HCL 12.5 MG/5ML PO ELIX: 12.5000 mg | ORAL_SOLUTION | ORAL | Status: DC | PRN

## 2017-04-01 MED ORDER — MIDAZOLAM HCL 2 MG/2ML IJ SOLN
INTRAMUSCULAR | Status: AC
  Filled 2017-04-01: qty 2

## 2017-04-01 MED ORDER — FENTANYL CITRATE (PF) 100 MCG/2ML IJ SOLN: 25.0000 ug | INTRAMUSCULAR | Status: DC | PRN

## 2017-04-01 SURGICAL SUPPLY — 38 items
BAG ZIPLOCK 12X15 (MISCELLANEOUS) ×3 IMPLANT
BLADE SAW SGTL 18X1.27X75 (BLADE) ×2 IMPLANT
BLADE SAW SGTL 18X1.27X75MM (BLADE) ×1
CAPT HIP TOTAL 2 ×3 IMPLANT
CELLS DAT CNTRL 66122 CELL SVR (MISCELLANEOUS) ×1 IMPLANT
COVER PERINEAL POST (MISCELLANEOUS) ×3 IMPLANT
COVER SURGICAL LIGHT HANDLE (MISCELLANEOUS) ×3 IMPLANT
DRAPE STERI IOBAN 125X83 (DRAPES) ×3 IMPLANT
DRAPE U-SHAPE 47X51 STRL (DRAPES) ×6 IMPLANT
DRESSING AQUACEL AG SP 3.5X10 (GAUZE/BANDAGES/DRESSINGS) ×1 IMPLANT
DRSG AQUACEL AG ADV 3.5X10 (GAUZE/BANDAGES/DRESSINGS) ×3 IMPLANT
DRSG AQUACEL AG SP 3.5X10 (GAUZE/BANDAGES/DRESSINGS) ×3
DURAPREP 26ML APPLICATOR (WOUND CARE) ×3 IMPLANT
ELECT REM PT RETURN 15FT ADLT (MISCELLANEOUS) ×3 IMPLANT
GAUZE XEROFORM 1X8 LF (GAUZE/BANDAGES/DRESSINGS) ×3 IMPLANT
GLOVE BIO SURGEON STRL SZ7.5 (GLOVE) ×3 IMPLANT
GLOVE BIOGEL PI IND STRL 7.5 (GLOVE) ×4 IMPLANT
GLOVE BIOGEL PI IND STRL 8 (GLOVE) ×2 IMPLANT
GLOVE BIOGEL PI INDICATOR 7.5 (GLOVE) ×8
GLOVE BIOGEL PI INDICATOR 8 (GLOVE) ×4
GLOVE ECLIPSE 8.0 STRL XLNG CF (GLOVE) ×3 IMPLANT
GLOVE SURG SS PI 7.0 STRL IVOR (GLOVE) ×3 IMPLANT
GLOVE SURG SS PI 7.5 STRL IVOR (GLOVE) ×3 IMPLANT
GOWN STRL REUS W/TWL XL LVL3 (GOWN DISPOSABLE) ×9 IMPLANT
HANDPIECE INTERPULSE COAX TIP (DISPOSABLE) ×2
HEAD CERAMIC 36 PLUS 8.5 12 14 (Hips) ×3 IMPLANT
HOLDER FOLEY CATH W/STRAP (MISCELLANEOUS) ×3 IMPLANT
PACK ANTERIOR HIP CUSTOM (KITS) ×3 IMPLANT
RTRCTR WOUND ALEXIS 18CM MED (MISCELLANEOUS) ×3
SET HNDPC FAN SPRY TIP SCT (DISPOSABLE) ×1 IMPLANT
STAPLER VISISTAT 35W (STAPLE) ×3 IMPLANT
SUT ETHIBOND NAB CT1 #1 30IN (SUTURE) ×3 IMPLANT
SUT VIC AB 0 CT1 36 (SUTURE) ×3 IMPLANT
SUT VIC AB 1 CT1 36 (SUTURE) ×3 IMPLANT
SUT VIC AB 2-0 CT1 27 (SUTURE) ×4
SUT VIC AB 2-0 CT1 TAPERPNT 27 (SUTURE) ×2 IMPLANT
TRAY FOLEY CATH SILVER 14FR (SET/KITS/TRAYS/PACK) ×3 IMPLANT
YANKAUER SUCT BULB TIP 10FT TU (MISCELLANEOUS) ×3 IMPLANT

## 2017-04-01 NOTE — H&P (Signed)
TOTAL HIP ADMISSION H&P  Patient is admitted for left total hip arthroplasty.  Subjective:  Chief Complaint: left hip pain  HPI: [name], 56 y.o. female, has a history of pain and functional disability in the left hip(s) due to arthritis and patient has failed non-surgical conservative treatments for greater than 12 weeks to include NSAID's and/or analgesics, corticosteriod injections, flexibility and strengthening excercises, supervised PT with diminished ADL's post treatment, use of assistive devices, weight reduction as appropriate and activity modification.  Onset of symptoms was gradual starting 1 years ago with gradually worsening course since that time.The patient noted no past surgery on the left hip(s).  Patient currently rates pain in the left hip at 10 out of 10 with activity. Patient has night pain, worsening of pain with activity and weight bearing, trendelenberg gait, pain that interfers with activities of daily living and pain with passive range of motion. Patient has evidence of subchondral sclerosis, periarticular osteophytes and joint space narrowing by imaging studies. This condition presents safety issues increasing the risk of falls.  There is no current active infection.  Patient Active Problem List   Diagnosis Date Noted  . Pain of left hip joint 01/17/2017  . Trochanteric bursitis, left hip 11/08/2016  . Status post right hip replacement 11/08/2016  . Right knee pain 11/08/2016  . Unilateral primary osteoarthritis, left hip 08/27/2016  . Status post total replacement of right hip 08/27/2016   Past Medical History:  Diagnosis Date  . Arthritis   . Hypertension    pt denies an actual diagnois; states "ive been talked to about it bu tnot prescribed any medication "     Past Surgical History:  Procedure Laterality Date  . BREAST LUMPECTOMY     unaware which breast   . TOTAL HIP ARTHROPLASTY Right 08/27/2016   Procedure: RIGHT TOTAL HIP ARTHROPLASTY  ANTERIOR APPROACH;  Surgeon: Kathryne Hitchhristopher Y Ally Knodel, MD;  Location: [facility];  Service: Orthopedics;  Laterality: Right;    Current Facility-Administered Medications  Medication Dose Route Frequency Provider Last Rate Last Dose  . ceFAZolin (ANCEF) 3 g in dextrose 5 % 50 mL IVPB  3 g Intravenous On Call to OR Kathryne HitchBlackman, Tywon Niday Y, MD      . chlorhexidine (HIBICLENS) 4 % liquid 4 application  60 mL Topical Once Kirtland Bouchardlark, Gilbert W, PA-C      . lactated ringers infusion   Intravenous Continuous [name] Hearingurk, Stephen Edward, MD 100 mL/hr at 04/01/17 0612 1,000 mL at 04/01/17 0612  . tranexamic acid (CYKLOKAPRON) 1,000 mg in sodium chloride 0.9 % 100 mL IVPB  1,000 mg Intravenous To OR Kirtland Bouchardlark, Gilbert W, PA-C       No Known Allergies  Social History  Substance Use Topics  . Smoking status: Never Smoker  . Smokeless tobacco: Never Used  . Alcohol use Yes     Comment: occasionally     Family History  Problem Relation Age of Onset  . Diabetes Father   . Hypertension Father   . Diabetes Mother   . Hypertension Brother      Review of Systems  Musculoskeletal: Positive for joint pain.  All other systems reviewed and are negative.   Objective:  Physical Exam  Constitutional: She is oriented to person, place, and time. She appears well-developed and well-nourished.  HENT:  Head: Normocephalic and atraumatic.  Eyes: Pupils are equal, round, and reactive to light. EOM are normal.  Neck: Normal range of motion. Neck supple.  Cardiovascular: Normal rate and regular rhythm.  Respiratory: Effort normal and breath sounds normal.  GI: Soft. Bowel sounds are normal.  Musculoskeletal:       Left hip: She exhibits decreased range of motion, decreased strength, tenderness and bony tenderness.  Neurological: She is alert and oriented to person, place, and time.  Skin: Skin is warm and dry.  Psychiatric: She has a normal mood and affect.    Vital signs in last 24 hours: Temp:  [98.5 F (36.9 C)] 98.5  F (36.9 C) (10/26 0541) Pulse Rate:  [64] 64 (10/26 0541) Resp:  [16] 16 (10/26 0541) BP: (150)/(90) 150/90 (10/26 0541) SpO2:  [100 %] 100 % (10/26 0541) Weight:  [279 lb (126.6 kg)] 279 lb (126.6 kg) (10/26 0601)  Labs:   Estimated body mass index is 47.89 kg/m as calculated from the following:   Height as of this encounter: 5\' 4"  (1.626 m).   Weight as of this encounter: 279 lb (126.6 kg).   Imaging Review Plain radiographs demonstrate severe degenerative joint disease of the left hip(s). The bone quality appears to be excellent for age and reported activity level.  Assessment/Plan:  End stage arthritis, left hip(s)  The patient history, physical examination, clinical judgement of the provider and imaging studies are consistent with end stage degenerative joint disease of the left hip(s) and total hip arthroplasty is deemed medically necessary. The treatment options including medical management, injection therapy, arthroscopy and arthroplasty were discussed at length. The risks and benefits of total hip arthroplasty were presented and reviewed. The risks due to aseptic loosening, infection, stiffness, dislocation/subluxation,  thromboembolic complications and other imponderables were discussed.  The patient acknowledged the explanation, agreed to proceed with the plan and consent was signed. Patient is being admitted for inpatient treatment for surgery, pain control, PT, OT, prophylactic antibiotics, VTE prophylaxis, progressive ambulation and ADL's and discharge planning.The patient is planning to be discharged home with home health services

## 2017-04-01 NOTE — Brief Op Note (Signed)
04/01/2017  9:12 AM  PATIENT:  [name]  56 y.o. female  PRE-OPERATIVE DIAGNOSIS:  osteoarthritis left hip  POST-OPERATIVE DIAGNOSIS:  osteoarthritis left hip  PROCEDURE:  Procedure(s): LEFT TOTAL HIP ARTHROPLASTY ANTERIOR APPROACH (Left)  SURGEON:  Surgeon(s) and Role:    Kathryne Hitch* Elmin Wiederholt Y, MD - Primary  PHYSICIAN ASSISTANT: Rexene EdisonGil Clark, PA-C  ANESTHESIA:   spinal  EBL:  250 mL   COUNTS:  YES  DICTATION: .Other Dictation: Dictation Number [id number]  PLAN OF CARE: Admit to inpatient   PATIENT DISPOSITION:  PACU - hemodynamically stable.   Delay start of Pharmacological VTE agent (>24hrs) due to surgical blood loss or risk of bleeding: no

## 2017-04-01 NOTE — Transfer of Care (Signed)
Immediate Anesthesia Transfer of Care Note  Patient: [name]  Procedure(s) Performed: LEFT TOTAL HIP ARTHROPLASTY ANTERIOR APPROACH (Left Hip)  Patient Location: PACU  Anesthesia Type:Spinal  Level of Consciousness: awake, alert  and oriented  Airway & Oxygen Therapy: Patient Spontanous Breathing and Patient connected to face mask oxygen  Post-op Assessment: Report given to RN and Post -op Vital signs reviewed and stable  Post vital signs: Reviewed and stable  Last Vitals:  Vitals:   04/01/17 0541  BP: (!) 150/90  Pulse: 64  Resp: 16  Temp: 36.9 C  SpO2: 100%    Last Pain:  Vitals:   04/01/17 0601  TempSrc:   PainSc: 7       Patients Stated Pain Goal: 4 (04/01/17 0601)  Complications: No apparent anesthesia complications

## 2017-04-01 NOTE — Anesthesia Procedure Notes (Signed)
Spinal  Patient location during procedure: OR End time: 04/01/2017 7:42 AM Staffing Resident/CRNA: Noralyn Pick D Performed: resident/CRNA  Preanesthetic Checklist Completed: patient identified, site marked, surgical consent, pre-op evaluation, timeout performed, IV checked, risks and benefits discussed and monitors and equipment checked Spinal Block Patient position: sitting Prep: Betadine Patient monitoring: heart rate, continuous pulse ox and blood pressure Approach: midline Location: L2-3 Injection technique: single-shot Needle Needle type: Sprotte  Needle gauge: 24 G Needle length: 9 cm Assessment Sensory level: T6 Additional Notes Expiration date of kit checked and confirmed. Patient tolerated procedure well, without complications.

## 2017-04-01 NOTE — Anesthesia Postprocedure Evaluation (Signed)
Anesthesia Post Note  Patient: [name]  Procedure(s) Performed: LEFT TOTAL HIP ARTHROPLASTY ANTERIOR APPROACH (Left Hip)     Patient location during evaluation: PACU Anesthesia Type: Spinal Level of consciousness: oriented and awake and alert Pain management: pain level controlled Vital Signs Assessment: post-procedure vital signs reviewed and stable Respiratory status: spontaneous breathing, respiratory function stable and patient connected to nasal cannula oxygen Cardiovascular status: blood pressure returned to baseline and stable Postop Assessment: no headache, no backache, no apparent nausea or vomiting and spinal receding Anesthetic complications: no    Last Vitals:  Vitals:   04/01/17 1015 04/01/17 1034  BP: (!) 142/80 (!) 156/82  Pulse: (!) 52 (!) 53  Resp: 16 14  Temp: (!) 36.3 C (!) 36.3 C  SpO2: 100% 100%    Last Pain:  Vitals:   04/01/17 1015  TempSrc:   PainSc: 0-No pain                 [name] HearingStephen Edward Turk

## 2017-04-01 NOTE — Op Note (Signed)
NAME:  [name], [name]               ACCOUNT NO.:  MEDICAL RECORD NO.:  [id number]  LOCATION:                                 FACILITY:  PHYSICIAN:  Vanita PandaChristopher Y. Magnus IvanBlackman, M.D.DATE OF BIRTH:  DATE OF PROCEDURE:  04/01/2017 DATE OF DISCHARGE:                              OPERATIVE REPORT   PREOPERATIVE DIAGNOSIS:  Primary osteoarthritis and degenerative joint disease, right hip.  POSTOPERATIVE DIAGNOSIS:  Primary osteoarthritis and degenerative joint disease, right hip.  PROCEDURE:  Right total hip arthroplasty through direct anterior approach.  IMPLANTS:  DePuy Sector Gription acetabular component size 52, size 36 +0 polyethylene liner with 1 screw as well.  Size 11 Corail femoral component with varus offset, size 36 +5 ceramic hip ball.  SURGEON:  Vanita PandaChristopher Y. Magnus IvanBlackman, M.D.  ASSISTANT:  Richardean CanalGilbert Clark, P.A.  ANESTHESIA:  Spinal.  ANTIBIOTICS:  3 g IV Ancef.  BLOOD LOSS:  250 mL.  COMPLICATIONS:  None.  INDICATIONS:  [name] is a very pleasant, 56 year old, morbidly obese female with debilitating arthritis involving both her hips.  She underwent a successful right total hip arthroplasty in March of this year and now presents for left total hip arthroplasty.  Her x-ray showed complete loss of her joint space on the left side.  Her pain is daily and it has detrimentally affected her activities of daily living, her quality of life, and her mobility.  She does wish to proceed with a total hip arthroplasty.  She understands the difficulty of this case given her morbid obesity.  She understands there is heightened risk of acute blood loss anemia, nerve and vessel injury, fracture, infection, dislocation, DVT, and implant failure.  She understands our goals are to decrease pain, improve mobility, and overall improve quality of life.  PROCEDURE DESCRIPTION:  After informed consent was obtained, appropriate left hip was marked.  She was brought to the operating  room, where spinal anesthesia was obtained while she was on her stretcher.  She was laid in a supine position.  A Foley catheter was placed and both feet had traction boots applied to them.  Next, she was placed supine on the Hana fracture table with the perineal post in place and both legs in inline skeletal traction devices, but no traction applied.  Her left operative hip was prepped and draped with DuraPrep and sterile drapes. Time-out was called and she was identified as correct patient and correct left hip.  I then made incision just inferior and posterior to the anterior superior iliac spine and carried this obliquely down the leg.  We dissected down the tensor fascia lata muscle and the tensor fascia was then divided longitudinally to proceed with a direct anterior approach to the hip.  I identified and cauterized circumflex vessels and then identified the hip capsule.  I opened up the hip capsule in an L- type format, finding a large joint effusion and significant arthritis throughout her entire left hip joint.  We placed Cobra retractors around the medial and lateral femoral neck and then made our femoral neck cut which I thought was a blood vessel in the lesser trochanter, but it was really almost at the level of the calcar.  She had a very short femoral neck.  We completed the cut with an osteotome.  I placed a corkscrew guide in the femoral head and removed the femoral head in its entirety and found it to be devoid of cartilage.  We then removed remnants of acetabular labrum and other periarticular osteophytes around the acetabulum.  I placed a bent Hohmann over the medial acetabular rim and then began reaming under direct visualization from a size 42 reamer, it is going in stepwise increments up to a size 52 with all reamers under direct visualization and the last reamer under direct fluoroscopy, so we could obtain our depth of reaming, our inclination, and anteversion. Once  I was pleased with this, we placed the real DePuy Sector Gription acetabular component size 52 and a single screw.  We then placed a 36 +0 polyethylene liner for that size acetabular component.  Attention was then turned to the femur.  With the leg externally rotated to 120 degrees, extended and adducted, we were able to place a Mueller retractor medially and a Hohmann retractor behind the greater trochanter.  I released the lateral joint capsule and used a box cutting osteotome to enter the femoral canal and a rongeur to lateralize.  I then began broaching from a size 8 broach using the Corail broaching system, going up to size 11.  With 11 in place, we trialed a standard neck and a 36 +1.5 hip ball.  We thought that we needed more leg length and offset.  We dislocated the hip and removed the trial components.  I then placed the real Corail femoral component size 11 with varus offset. Knowing that it would shorten her, we set to go with a +8.5 hip ball. We put that on and then placed with traction and internal rotation.  We tied this in the pelvis where she was too tight, her leg lengths too long, decided to remove that 8.5 hip ball and then go with the real +5 hip ball, reduced this in the acetabulum and she was tight and felt stable and felt like we had equaled her leg length based on her preoperative exam and x-rays as well.  We then irrigated the soft tissue with normal saline solution using pulsatile lavage.  We closed the joint capsule with interrupted #1 Ethibond suture followed by running #1 Vicryl on the tensor fascia, 0 Vicryl in the deep tissue, 2-0 Vicryl in the subcutaneous tissue, interrupted staples on the skin, and Aquacel dressing and Xeroform were applied.  She was taken to the recovery room in stable condition.  All final counts were correct.  There were no complications noted.  Of note, Richardean Canal, PA-C, assisted in entire case, his assistance was crucial for  facilitating all aspects of this case.     Vanita Panda. Magnus Ivan, M.D.     CYB/MEDQ  D:  04/01/2017  T:  04/01/2017  Job:  [id number]

## 2017-04-01 NOTE — Evaluation (Signed)
Physical Therapy Evaluation Patient Details Name: [name] MRN: [id number] DOB: [birth year] Today's Date: 04/01/2017   History of Present Illness  Pt s/p L THR and with hx of R THR 08/27/16  Clinical Impression  Pt s/p L THR and presents with decreased L LE strength/ROM and post op pain limiting functional mobility.  Pt should progress to dc home with family assist.    Follow Up Recommendations No PT follow up (sister is PT and will provide follow up HHPT)    Equipment Recommendations  None recommended by PT    Recommendations for Other Services OT consult     Precautions / Restrictions Precautions Precautions: Fall Restrictions Weight Bearing Restrictions: No Other Position/Activity Restrictions: WBAT      Mobility  Bed Mobility Overal bed mobility: Needs Assistance Bed Mobility: Supine to Sit     Supine to sit: Min guard     General bed mobility comments: min cues for sequence; HOB elevated  Transfers Overall transfer level: Needs assistance Equipment used: Rolling walker (2 wheeled) Transfers: Sit to/from Stand Sit to Stand: Min guard         General transfer comment: cues for use of UEs to self assist  Ambulation/Gait Ambulation/Gait assistance: Min assist Ambulation Distance (Feet): 100 Feet Assistive device: Rolling walker (2 wheeled) Gait Pattern/deviations: Step-to pattern;Step-through pattern;Decreased step length - right;Decreased step length - left;Shuffle;Trunk flexed Gait velocity: decr Gait velocity interpretation: Below normal speed for age/gender General Gait Details: cues for posture, position from RW and intial sequence  Stairs            Wheelchair Mobility    Modified Rankin (Stroke Patients Only)       Balance                                             Pertinent Vitals/Pain Pain Assessment: 0-10 Pain Score: 5  Pain Location: L hip Pain Descriptors / Indicators: Aching;Sore Pain  Intervention(s): Limited activity within patient's tolerance;Monitored during session;Premedicated before session;Ice applied    Home Living Family/patient expects to be discharged to:: Private residence Living Arrangements: Other relatives Available Help at Discharge: Family Type of Home: House Home Access: Stairs to enter Entrance Stairs-Rails: Right;Left;Can reach both Entrance Stairs-Number of Steps: 3 Home Layout: One level Home Equipment: Environmental consultantWalker - 2 wheels;Cane - single point Additional Comments: Pt[facility] and will be providing post op care including PT services    Prior Function Level of Independence: Independent               Hand Dominance        Extremity/Trunk Assessment   Upper Extremity Assessment Upper Extremity Assessment: Overall WFL for tasks assessed    Lower Extremity Assessment Lower Extremity Assessment: LLE deficits/detail    Cervical / Trunk Assessment Cervical / Trunk Assessment: Normal  Communication   Communication: No difficulties  Cognition Arousal/Alertness: Awake/alert Behavior During Therapy: WFL for tasks assessed/performed Overall Cognitive Status: Within Functional Limits for tasks assessed                                        General Comments      Exercises     Assessment/Plan    PT Assessment Patient needs continued PT services  PT Problem List  Decreased strength;Decreased range of motion;Decreased activity tolerance;Decreased mobility;Decreased knowledge of use of DME;Obesity;Pain       PT Treatment Interventions DME instruction;Gait training;Stair training;Functional mobility training;Therapeutic activities;Therapeutic exercise;Patient/family education    PT Goals (Current goals can be found in the Care Plan section)  Acute Rehab PT Goals Patient Stated Goal: Regain IND PT Goal Formulation: With patient Time For Goal Achievement: 04/06/17 Potential to Achieve Goals: Good     Frequency 7X/week   Barriers to discharge        Co-evaluation               AM-PAC PT "6 Clicks" Daily Activity  Outcome Measure Difficulty turning over in bed (including adjusting bedclothes, sheets and blankets)?: A Lot Difficulty moving from lying on back to sitting on the side of the bed? : A Lot Difficulty sitting down on and standing up from a chair with arms (e.g., wheelchair, bedside commode, etc,.)?: A Lot Help needed moving to and from a bed to chair (including a wheelchair)?: A Little Help needed walking in hospital room?: A Little Help needed climbing 3-5 steps with a railing? : A Little 6 Click Score: 15    End of Session   Activity Tolerance: Patient tolerated treatment well Patient left: in chair;with call bell/phone within reach;with family/visitor present Nurse Communication: Mobility status PT Visit Diagnosis: Difficulty in walking, not elsewhere classified (R26.2)    Time: 1610-9604 PT Time Calculation (min) (ACUTE ONLY): 18 min   Charges:   PT Evaluation $PT Eval Low Complexity: 1 Low     PT G Codes:        Pg [phone]   Keyani Rigdon 04/01/2017, 6:11 PM

## 2017-04-02 LAB — BASIC METABOLIC PANEL
Anion gap: 8 (ref 5–15)
BUN: 12 mg/dL (ref 6–20)
CHLORIDE: 108 mmol/L (ref 101–111)
CO2: 25 mmol/L (ref 22–32)
CREATININE: 0.73 mg/dL (ref 0.44–1.00)
Calcium: 8.7 mg/dL — ABNORMAL LOW (ref 8.9–10.3)
GFR calc Af Amer: >60 mL/min (ref >60)
GFR calc non Af Amer: >60 mL/min (ref >60)
Glucose, Bld: 113 mg/dL — ABNORMAL HIGH (ref 65–99)
Potassium: 4.4 mmol/L (ref 3.5–5.1)
SODIUM: 141 mmol/L (ref 135–145)

## 2017-04-02 LAB — CBC
HCT: 38.4 % (ref 36.0–46.0)
Hemoglobin: 13.2 g/dL (ref 12.0–15.0)
MCH: 28.6 pg (ref 26.0–34.0)
MCHC: 34.4 g/dL (ref 30.0–36.0)
MCV: 83.1 fL (ref 78.0–100.0)
Platelets: 183 10*3/uL (ref 150–400)
RBC: 4.62 MIL/uL (ref 3.87–5.11)
RDW: 14.2 % (ref 11.5–15.5)
WBC: 8 10*3/uL (ref 4.0–10.5)

## 2017-04-02 MED ORDER — GABAPENTIN 100 MG PO CAPS: 100.0000 mg | ORAL_CAPSULE | Freq: Three times a day (TID) | ORAL | 0 refills | Status: AC

## 2017-04-02 MED ORDER — METHOCARBAMOL 500 MG PO TABS: 500.0000 mg | ORAL_TABLET | Freq: Four times a day (QID) | ORAL | 0 refills | Status: DC | PRN

## 2017-04-02 MED ORDER — ASPIRIN 81 MG PO CHEW: 81.0000 mg | CHEWABLE_TABLET | Freq: Two times a day (BID) | ORAL | 0 refills | Status: AC

## 2017-04-02 MED ORDER — OXYCODONE-ACETAMINOPHEN 5-325 MG PO TABS: 1.0000 | ORAL_TABLET | ORAL | 0 refills | Status: DC | PRN

## 2017-04-02 NOTE — Progress Notes (Signed)
Pt's pain is under control, vitals are WNL, tolerating diet and has the DME at home. Discussed discharge instructions with both patient and sister. Discharged to home with prescriptions.

## 2017-04-02 NOTE — Discharge Instructions (Signed)

## 2017-04-02 NOTE — Care Management Note (Signed)
Case Management Note  Patient Details  Name: [name] MRN: [id number] Date of Birth: 1960/09/18  Subjective/Objective:    S/p THR                Action/Plan: Discharge Planning: NCM spoke to pt and sister, Coy SaunasRosemary at bedside. Pt states she will be staying with sister, [name] at [facility] and she is a Physical Therapist who will be able to assist with her rehab at home. She has RW and bedside commode from a previous surgery at home. Pt declines HH at this time. Contacted [facility] to make aware.   PCP COUSINS, SHERONETTE MD  Expected Discharge Date:  04/02/17               Expected Discharge Plan:  Home w Home Health Services  In-House Referral:  NA  Discharge planning Services  CM Consult  Post Acute Care Choice:  Home Health Choice offered to:  Patient  DME Arranged:  N/A DME Agency:  NA  HH Arranged:  Patient Refused HH HH Agency:  NA  Status of Service:  Completed, signed off  If discussed at Long Length of Stay Meetings, dates discussed:    Additional Comments:  Elliot CousinShavis, Render Marley Ellen, RN 04/02/2017, 10:16 AM

## 2017-04-02 NOTE — Progress Notes (Signed)
Physical Therapy Treatment Patient Details Name: [name] MRN: [id number] DOB: April 09, 1961 Today's Date: 04/02/2017    History of Present Illness Pt s/p L THR and with hx of R THR 08/27/16    PT Comments    Pt motivated, progressing well with mobility and eager for dc home this date.   Follow Up Recommendations  No PT follow up     Equipment Recommendations  None recommended by PT    Recommendations for Other Services OT consult     Precautions / Restrictions Precautions Precautions: Fall Restrictions Weight Bearing Restrictions: No Other Position/Activity Restrictions: WBAT    Mobility  Bed Mobility               General bed mobility comments: Pt up in chair and requests back to same.  Pt reports no difficulty with bed mobility  Transfers Overall transfer level: Needs assistance Equipment used: Rolling walker (2 wheeled) Transfers: Sit to/from Stand Sit to Stand: Min guard;Supervision         General transfer comment: min cues for use of UEs to self assist  Ambulation/Gait Ambulation/Gait assistance: Min guard;Supervision Ambulation Distance (Feet): 300 Feet Assistive device: Rolling walker (2 wheeled) Gait Pattern/deviations: Step-to pattern;Step-through pattern;Decreased step length - right;Decreased step length - left;Shuffle;Trunk flexed Gait velocity: decr Gait velocity interpretation: Below normal speed for age/gender General Gait Details: min cues for posture, position from RW and intial sequence   Stairs Stairs: Yes   Stair Management: Two rails;Step to pattern;Forwards Number of Stairs: 2 General stair comments: cues for sequence  Wheelchair Mobility    Modified Rankin (Stroke Patients Only)       Balance Overall balance assessment: No apparent balance deficits (not formally assessed)                                          Cognition Arousal/Alertness: Awake/alert Behavior During Therapy: WFL for  tasks assessed/performed Overall Cognitive Status: Within Functional Limits for tasks assessed                                        Exercises Total Joint Exercises Ankle Circles/Pumps: AROM;Both;15 reps;Supine Quad Sets: AROM;Both;10 reps;Supine Heel Slides: AAROM;Left;20 reps;Supine Hip ABduction/ADduction: AAROM;Left;20 reps;Supine Long Arc Quad: AROM;Left;15 reps;Seated    General Comments        Pertinent Vitals/Pain Pain Assessment: 0-10 Pain Score: 5  Pain Location: L hip Pain Descriptors / Indicators: Aching;Sore Pain Intervention(s): Limited activity within patient's tolerance;Monitored during session;Premedicated before session;Ice applied    Home Living                      Prior Function            PT Goals (current goals can now be found in the care plan section) Acute Rehab PT Goals Patient Stated Goal: Regain IND PT Goal Formulation: With patient Time For Goal Achievement: 04/06/17 Potential to Achieve Goals: Good Progress towards PT goals: Progressing toward goals    Frequency    7X/week      PT Plan Current plan remains appropriate    Co-evaluation              AM-PAC PT "6 Clicks" Daily Activity  Outcome Measure  Difficulty turning over in bed (including adjusting bedclothes, sheets and  blankets)?: A Lot Difficulty moving from lying on back to sitting on the side of the bed? : A Lot Difficulty sitting down on and standing up from a chair with arms (e.g., wheelchair, bedside commode, etc,.)?: A Lot Help needed moving to and from a bed to chair (including a wheelchair)?: A Little Help needed walking in hospital room?: A Little Help needed climbing 3-5 steps with a railing? : A Little 6 Click Score: 15    End of Session   Activity Tolerance: Patient tolerated treatment well Patient left: in chair;with call bell/phone within reach;with family/visitor present Nurse Communication: Mobility status PT Visit  Diagnosis: Difficulty in walking, not elsewhere classified (R26.2)     Time: 9147-8295 PT Time Calculation (min) (ACUTE ONLY): 33 min  Charges:  $Gait Training: 8-22 mins $Therapeutic Exercise: 8-22 mins                    G Codes:       Pg [phone]    Laurice Kimmons 04/02/2017, 12:19 PM

## 2017-04-02 NOTE — Progress Notes (Signed)
Subjective: 1 Day Post-Op Procedure(s) (LRB): LEFT TOTAL HIP ARTHROPLASTY ANTERIOR APPROACH (Left) Patient reports pain as mild.  Has done very well with therapy.  Objective: Vital signs in last 24 hours: Temp:  [97.7 F (36.5 C)-98.3 F (36.8 C)] 98 F (36.7 C) (10/27 0703) Pulse Rate:  [59-84] 70 (10/27 0703) Resp:  [15-18] 18 (10/27 0703) BP: (141-165)/(64-80) 161/80 (10/27 0703) SpO2:  [95 %-100 %] 95 % (10/27 0703)  Intake/Output from previous day: 10/26 0701 - 10/27 0700 In: 5005 [P.O.:1200; I.V.:3750; IV Piggyback:55] Out: 2300 [Urine:2050; Blood:250] Intake/Output this shift: Total I/O In: 697.5 [P.O.:360; I.V.:337.5] Out: 300 [Urine:300]   Recent Labs  04/02/17 0537  HGB 13.2    Recent Labs  04/02/17 0537  WBC 8.0  RBC 4.62  HCT 38.4  PLT 183    Recent Labs  04/02/17 0537  NA 141  K 4.4  CL 108  CO2 25  BUN 12  CREATININE 0.73  GLUCOSE 113*  CALCIUM 8.7*   No results for input(s): LABPT, INR in the last 72 hours.  Sensation intact distally Intact pulses distally Dorsiflexion/Plantar flexion intact Incision: scant drainage  Assessment/Plan: 1 Day Post-Op Procedure(s) (LRB): LEFT TOTAL HIP ARTHROPLASTY ANTERIOR APPROACH (Left) Up with therapy Discharge home with home health this afternoon/today.  Kathryne HitchChristopher Y Blackman 04/02/2017, 11:18 AM

## 2017-04-02 NOTE — Discharge Summary (Signed)
Patient ID: [name] MRN: [id number] DOB/AGE: 11-11-1960 56 y.o.  Admit date: 04/01/2017 Discharge date: 04/02/2017  Admission Diagnoses:  Principal Problem:   Unilateral primary osteoarthritis, left hip Active Problems:   Status post total replacement of left hip   Discharge Diagnoses:  Same  Past Medical History:  Diagnosis Date  . Arthritis   . Hypertension    pt denies an actual diagnois; states "ive been talked to about it bu tnot prescribed any medication "     Surgeries: Procedure(s): LEFT TOTAL HIP ARTHROPLASTY ANTERIOR APPROACH on 04/01/2017   Consultants:   Discharged Condition: Improved  Hospital Course: Natasha Chromanntoinette V Balke is an 56 y.o. female who was admitted 04/01/2017 for operative treatment ofUnilateral primary osteoarthritis, left hip. Patient has severe unremitting pain that affects sleep, daily activities, and work/hobbies. After pre-op clearance the patient was taken to the operating room on 04/01/2017 and underwent  Procedure(s): LEFT TOTAL HIP ARTHROPLASTY ANTERIOR APPROACH.    Patient was given perioperative antibiotics: Anti-infectives    Start     Dose/Rate Route Frequency Ordered Stop   04/01/17 1400  ceFAZolin (ANCEF) IVPB 2g/100 mL premix     2 g 200 mL/hr over 30 Minutes Intravenous Every 6 hours 04/01/17 1038 04/01/17 2035   04/01/17 0600  ceFAZolin (ANCEF) 3 g in dextrose 5 % 50 mL IVPB     3 g 130 mL/hr over 30 Minutes Intravenous On call to O.R. 03/31/17 1404 04/01/17 0743       Patient was given sequential compression devices, early ambulation, and chemoprophylaxis to prevent DVT.  Patient benefited maximally from hospital stay and there were no complications.    Recent vital signs: Patient Vitals for the past 24 hrs:  BP Temp Temp src Pulse Resp SpO2  04/02/17 0703 (!) 161/80 98 F (36.7 C) - 70 18 95 %  04/02/17 0131 (!) 165/72 98.3 F (36.8 C) Oral 84 16 96 %  04/01/17 2255 (!) 157/74 97.8 F (36.6 C) - 78 16 98  %  04/01/17 1730 (!) 141/69 97.7 F (36.5 C) Axillary 72 15 98 %  04/01/17 1333 (!) 142/74 - - 79 16 97 %  04/01/17 1223 (!) 141/64 97.7 F (36.5 C) Axillary (!) 59 15 98 %  04/01/17 1146 (!) 148/79 97.8 F (36.6 C) Axillary (!) 59 16 100 %     Recent laboratory studies:  Recent Labs  04/02/17 0537  WBC 8.0  HGB 13.2  HCT 38.4  PLT 183  NA 141  K 4.4  CL 108  CO2 25  BUN 12  CREATININE 0.73  GLUCOSE 113*  CALCIUM 8.7*     Discharge Medications:   Allergies as of 04/02/2017   No Known Allergies     Medication List    STOP taking these medications   ibuprofen 200 MG tablet Commonly known as:  ADVIL,MOTRIN     TAKE these medications   aspirin 81 MG chewable tablet Chew 1 tablet (81 mg total) by mouth 2 (two) times daily.   gabapentin 100 MG capsule Commonly known as:  NEURONTIN Take 1 capsule (100 mg total) by mouth 3 (three) times daily.   methocarbamol 500 MG tablet Commonly known as:  ROBAXIN Take 1 tablet (500 mg total) by mouth every 6 (six) hours as needed for muscle spasms. What changed:  Another medication with the same name was added. Make sure you understand how and when to take each.   methocarbamol 500 MG tablet Commonly known as:  ROBAXIN  Take 1 tablet (500 mg total) by mouth every 6 (six) hours as needed for muscle spasms. What changed:  You were already taking a medication with the same name, and this prescription was added. Make sure you understand how and when to take each.   oxyCODONE-acetaminophen 5-325 MG tablet Commonly known as:  ROXICET Take 1-2 tablets by mouth every 4 (four) hours as needed.            Durable Medical Equipment        Start     Ordered   04/01/17 1039  DME Walker rolling  Once    Question:  Patient needs a walker to treat with the following condition  Answer:  Status post total replacement of left hip   04/01/17 1038   04/01/17 1039  DME 3 n 1  Once     04/01/17 1038      Diagnostic Studies: Dg  Pelvis Portable  Result Date: 04/01/2017 CLINICAL DATA:  Status post left hip replacement EXAM: PORTABLE PELVIS 1-2 VIEWS COMPARISON:  Intraoperative film from earlier in the same day FINDINGS: Bilateral hip prostheses are seen. No acute fracture or acute soft tissue abnormality is noted. IMPRESSION: Status post left hip replacement without acute abnormality. Electronically Signed   By: Alcide Clever M.D.   On: 04/01/2017 09:51   Dg C-arm 61-120 Min-no Report  Result Date: 04/01/2017 Fluoroscopy was utilized by the requesting physician.  No radiographic interpretation.   Dg Hip Operative Unilat W Or W/o Pelvis Left  Result Date: 04/01/2017 CLINICAL DATA:  Left hip replacement EXAM: OPERATIVE LEFT HIP WITH PELVIS; DG C-ARM 61-120 MIN-NO REPORT COMPARISON:  None. FLUOROSCOPY TIME:  Radiation Exposure Index (as provided by the fluoroscopic device): 3.44 mGy If the device does not provide the exposure index: Fluoroscopy Time:  21.6 seconds Number of Acquired Images:  2 FINDINGS: Left hip prosthesis is noted in satisfactory position. No acute bony abnormality is noted. Changes of prior right hip replacement are seen. IMPRESSION: Status post left hip replacement Electronically Signed   By: Alcide Clever M.D.   On: 04/01/2017 09:13    Disposition: 06-Home-Health Care Svc  Discharge Instructions    Discharge patient    Complete by:  As directed    Discharge disposition:  01-Home or Self Care   Discharge patient date:  04/02/2017      Follow-up Information    Kathryne Hitch, MD Follow up in 2 week(s).   Specialty:  Orthopedic Surgery Contact information: [address] [place] [address] [phone]            Signed: Kathryne Hitch 04/02/2017, 11:21 AM

## 2017-04-02 NOTE — Progress Notes (Signed)
OT Cancellation Note  Patient Details Name: [name] MRN: [id number] DOB: 1960-12-18   Cancelled Treatment:    Reason Eval/Treat Not Completed: OT screened, no needs identified, will sign off. Pt had right hip sx 3/18 and does not have any OT concerns per PT.  Evette GeorgesLeonard, Nadya Hopwood Eva [phone] 04/02/2017, 9:56 AM

## 2017-04-12 ENCOUNTER — Encounter (HOSPITAL_COMMUNITY): Payer: Self-pay

## 2017-04-18 ENCOUNTER — Encounter (INDEPENDENT_AMBULATORY_CARE_PROVIDER_SITE_OTHER): Payer: Self-pay | Admitting: Orthopaedic Surgery

## 2017-04-18 ENCOUNTER — Ambulatory Visit (INDEPENDENT_AMBULATORY_CARE_PROVIDER_SITE_OTHER): Payer: BLUE CROSS/BLUE SHIELD | Admitting: Orthopaedic Surgery

## 2017-04-18 DIAGNOSIS — Z96641 Presence of right artificial hip joint: Secondary | ICD-10-CM

## 2017-04-18 DIAGNOSIS — Z96642 Presence of left artificial hip joint: Secondary | ICD-10-CM

## 2017-04-18 MED ORDER — AMOXICILLIN 500 MG PO TABS: ORAL_TABLET | ORAL | 0 refills | Status: AC

## 2017-04-18 MED ORDER — OXYCODONE-ACETAMINOPHEN 5-325 MG PO TABS: 1.0000 | ORAL_TABLET | ORAL | 0 refills | Status: AC | PRN

## 2017-04-18 MED ORDER — METHOCARBAMOL 500 MG PO TABS: 500.0000 mg | ORAL_TABLET | Freq: Four times a day (QID) | ORAL | 0 refills | Status: AC | PRN

## 2017-04-18 NOTE — Progress Notes (Signed)
The patient is following up in just over 2 weeks status post a left total hip arthroplasty directed approach.  She is morbidly obese individual and has had a right total hip by me before.  She says she is doing well.  She is able with a cane.  She been on a baby aspirin twice a day and has no issues.  She would like a refill of her pain medication and muscle relaxant.  On exam her left hip incision looks good.  I removed the staples and Place Steri-Strips.  There were no comp gating features I can see.  Her leg lengths are equal.  At this point she will continue to increase her activities.  We will send an antibiotic for next dental visit since it is just in December and so close postoperative.  All questions and concerns were answered and addressed.  I will see her back in a month to see how she is doing overall.

## 2017-05-16 ENCOUNTER — Ambulatory Visit (INDEPENDENT_AMBULATORY_CARE_PROVIDER_SITE_OTHER): Payer: BLUE CROSS/BLUE SHIELD | Admitting: Orthopaedic Surgery

## 2017-05-23 ENCOUNTER — Ambulatory Visit (INDEPENDENT_AMBULATORY_CARE_PROVIDER_SITE_OTHER): Payer: BLUE CROSS/BLUE SHIELD | Admitting: Orthopaedic Surgery

## 2017-05-23 ENCOUNTER — Encounter (INDEPENDENT_AMBULATORY_CARE_PROVIDER_SITE_OTHER): Payer: Self-pay | Admitting: Orthopaedic Surgery

## 2017-05-23 DIAGNOSIS — Z96641 Presence of right artificial hip joint: Secondary | ICD-10-CM

## 2017-05-23 DIAGNOSIS — Z96642 Presence of left artificial hip joint: Secondary | ICD-10-CM

## 2017-05-23 NOTE — Progress Notes (Signed)
The patient is status post a left total hip arthroplasty that we did about 7-8 weeks ago.  She is over 8 months out from a right total hip arthroplasty.  She is walking without assistive device and says her left hip is done extremely well.  Her sister is a physical therapist and is working with her on weight loss and strengthen her quad muscles because she does get right knee stiffness.  On examination of her left hip she has fluid and full range of motion of both hips without any difficulty at all.  There is no numbness and tingling and no issues with the incisions themselves.  At this point should continue to increase her activities and work on weight loss.  I do not need to see her back now for 6 months unless there are any issues.  At that visit I would like a low AP pelvis and lateral of both her hips.

## 2017-11-12 IMAGING — RF DG HIP (WITH PELVIS) OPERATIVE*R*
1 series · 2 of 2 positions shown · non-contrast
Comparison: None.

CLINICAL DATA: Right total hip arthroplasty

EXAM:
OPERATIVE RIGHT HIP (WITH PELVIS IF PERFORMED) 2 VIEWS
TECHNIQUE: Fluoroscopic spot image(s) were submitted for interpretation
post-operatively.

[Series 1: run · 2 of 2 slices shown]
[im 1/2]
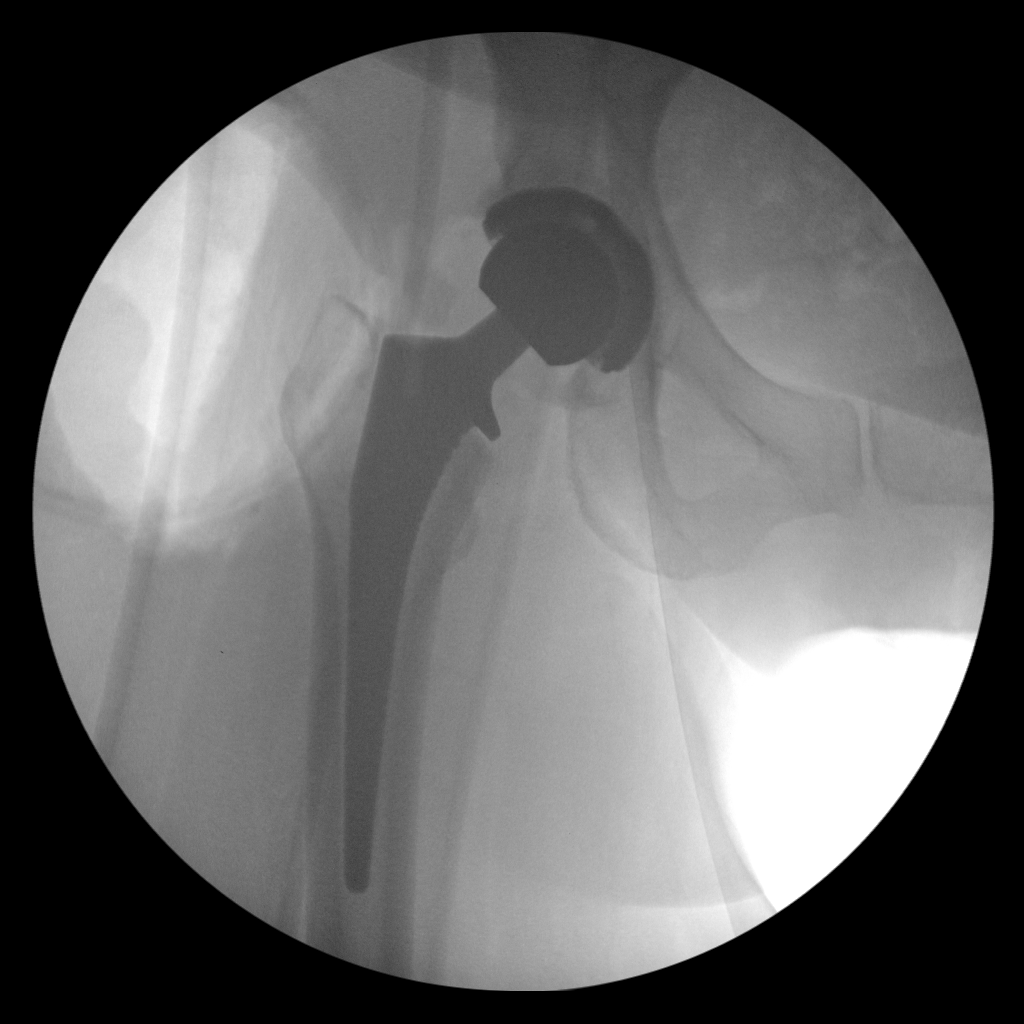
[im 2/2]
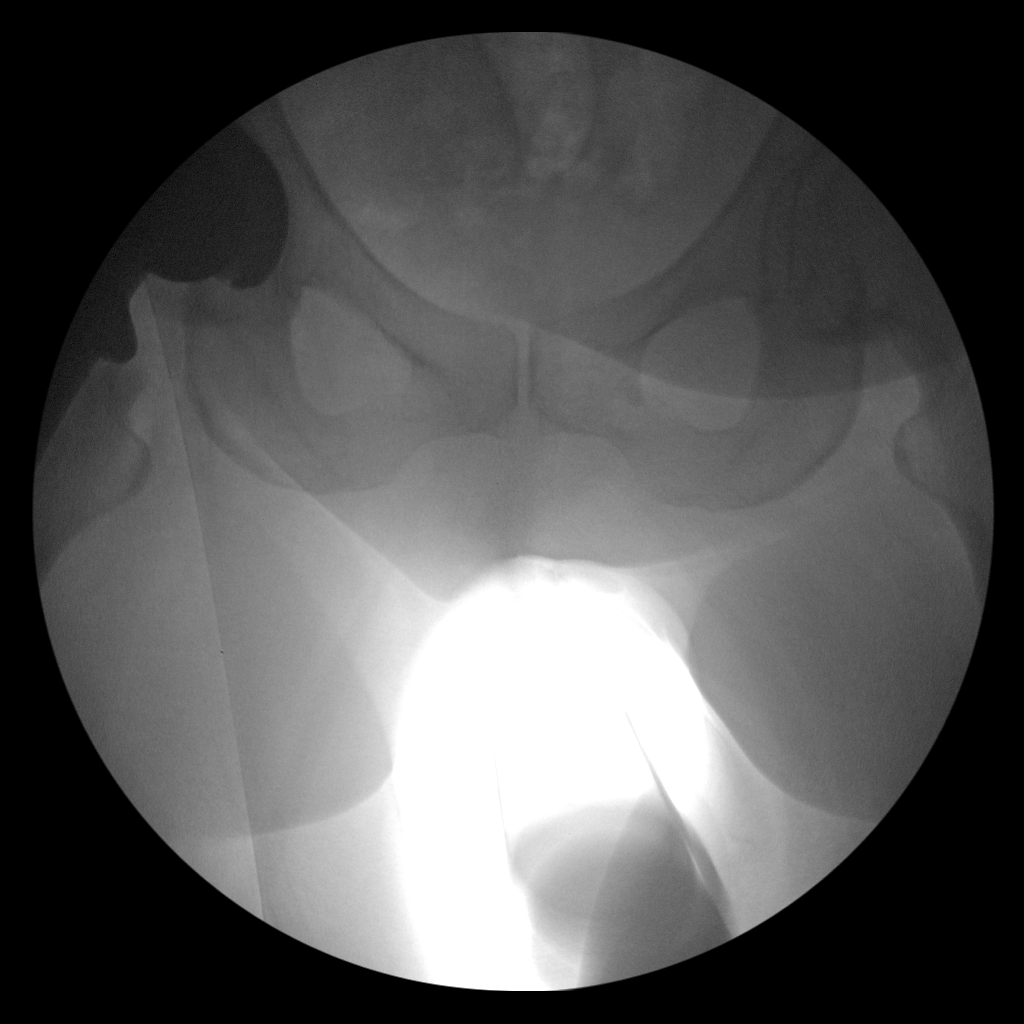

[2 of 2 positions shown; findings below may reference images not displayed]

FINDINGS: Fluoroscopy time 0.5 minutes. 2 spot fluoroscopic nondiagnostic
intraoperative right hip radiographs demonstrate expected
postsurgical changes from right total hip arthroplasty, with no
evidence of hip malalignment on these views.
IMPRESSION: Intraoperative fluoroscopic guidance for right total hip
arthroplasty.

## 2017-11-21 ENCOUNTER — Ambulatory Visit (INDEPENDENT_AMBULATORY_CARE_PROVIDER_SITE_OTHER): Payer: BLUE CROSS/BLUE SHIELD | Admitting: Orthopaedic Surgery

## 2018-02-12 ENCOUNTER — Encounter (HOSPITAL_COMMUNITY): Payer: Self-pay

## 2018-02-12 ENCOUNTER — Inpatient Hospital Stay (HOSPITAL_COMMUNITY)
Admission: EM | Admit: 2018-02-12 | Discharge: 2018-03-07 | DRG: 208 | Disposition: E | Payer: BLUE CROSS/BLUE SHIELD | Attending: Pulmonary Disease | Admitting: Pulmonary Disease

## 2018-02-12 ENCOUNTER — Inpatient Hospital Stay (HOSPITAL_COMMUNITY): Admission: EM | Disposition: E | Payer: Self-pay | Source: Home / Self Care | Attending: Pulmonary Disease

## 2018-02-12 ENCOUNTER — Inpatient Hospital Stay (HOSPITAL_COMMUNITY): Payer: BLUE CROSS/BLUE SHIELD

## 2018-02-12 ENCOUNTER — Ambulatory Visit (HOSPITAL_COMMUNITY): Admit: 2018-02-12 | Payer: BLUE CROSS/BLUE SHIELD | Admitting: Cardiology

## 2018-02-12 ENCOUNTER — Emergency Department (HOSPITAL_COMMUNITY): Payer: BLUE CROSS/BLUE SHIELD

## 2018-02-12 DIAGNOSIS — J9601 Acute respiratory failure with hypoxia: Secondary | ICD-10-CM | POA: Diagnosis present

## 2018-02-12 DIAGNOSIS — I2699 Other pulmonary embolism without acute cor pulmonale: Secondary | ICD-10-CM | POA: Diagnosis present

## 2018-02-12 DIAGNOSIS — R739 Hyperglycemia, unspecified: Secondary | ICD-10-CM | POA: Diagnosis present

## 2018-02-12 DIAGNOSIS — Z452 Encounter for adjustment and management of vascular access device: Secondary | ICD-10-CM

## 2018-02-12 DIAGNOSIS — J939 Pneumothorax, unspecified: Secondary | ICD-10-CM

## 2018-02-12 DIAGNOSIS — Z96643 Presence of artificial hip joint, bilateral: Secondary | ICD-10-CM | POA: Diagnosis present

## 2018-02-12 DIAGNOSIS — E875 Hyperkalemia: Secondary | ICD-10-CM | POA: Diagnosis present

## 2018-02-12 DIAGNOSIS — R680 Hypothermia, not associated with low environmental temperature: Secondary | ICD-10-CM | POA: Diagnosis present

## 2018-02-12 DIAGNOSIS — I50810 Right heart failure, unspecified: Secondary | ICD-10-CM | POA: Diagnosis present

## 2018-02-12 DIAGNOSIS — Z6841 Body Mass Index (BMI) 40.0 and over, adult: Secondary | ICD-10-CM

## 2018-02-12 DIAGNOSIS — N179 Acute kidney failure, unspecified: Secondary | ICD-10-CM | POA: Diagnosis present

## 2018-02-12 DIAGNOSIS — Z978 Presence of other specified devices: Secondary | ICD-10-CM

## 2018-02-12 DIAGNOSIS — G931 Anoxic brain damage, not elsewhere classified: Secondary | ICD-10-CM | POA: Diagnosis present

## 2018-02-12 DIAGNOSIS — I249 Acute ischemic heart disease, unspecified: Secondary | ICD-10-CM

## 2018-02-12 DIAGNOSIS — J189 Pneumonia, unspecified organism: Secondary | ICD-10-CM

## 2018-02-12 DIAGNOSIS — Z515 Encounter for palliative care: Secondary | ICD-10-CM | POA: Diagnosis not present

## 2018-02-12 DIAGNOSIS — R570 Cardiogenic shock: Secondary | ICD-10-CM | POA: Diagnosis present

## 2018-02-12 DIAGNOSIS — I213 ST elevation (STEMI) myocardial infarction of unspecified site: Secondary | ICD-10-CM | POA: Diagnosis not present

## 2018-02-12 DIAGNOSIS — Z4682 Encounter for fitting and adjustment of non-vascular catheter: Secondary | ICD-10-CM

## 2018-02-12 DIAGNOSIS — Z9911 Dependence on respirator [ventilator] status: Secondary | ICD-10-CM | POA: Diagnosis not present

## 2018-02-12 DIAGNOSIS — D696 Thrombocytopenia, unspecified: Secondary | ICD-10-CM | POA: Diagnosis present

## 2018-02-12 DIAGNOSIS — J690 Pneumonitis due to inhalation of food and vomit: Secondary | ICD-10-CM | POA: Diagnosis present

## 2018-02-12 DIAGNOSIS — Z8249 Family history of ischemic heart disease and other diseases of the circulatory system: Secondary | ICD-10-CM | POA: Diagnosis not present

## 2018-02-12 DIAGNOSIS — E874 Mixed disorder of acid-base balance: Secondary | ICD-10-CM | POA: Diagnosis present

## 2018-02-12 DIAGNOSIS — G936 Cerebral edema: Secondary | ICD-10-CM | POA: Diagnosis present

## 2018-02-12 DIAGNOSIS — Z7982 Long term (current) use of aspirin: Secondary | ICD-10-CM

## 2018-02-12 DIAGNOSIS — Z66 Do not resuscitate: Secondary | ICD-10-CM | POA: Diagnosis present

## 2018-02-12 DIAGNOSIS — J930 Spontaneous tension pneumothorax: Secondary | ICD-10-CM

## 2018-02-12 DIAGNOSIS — R34 Anuria and oliguria: Secondary | ICD-10-CM | POA: Diagnosis present

## 2018-02-12 DIAGNOSIS — J9602 Acute respiratory failure with hypercapnia: Secondary | ICD-10-CM | POA: Diagnosis present

## 2018-02-12 DIAGNOSIS — Z833 Family history of diabetes mellitus: Secondary | ICD-10-CM

## 2018-02-12 DIAGNOSIS — I469 Cardiac arrest, cause unspecified: Secondary | ICD-10-CM | POA: Diagnosis not present

## 2018-02-12 DIAGNOSIS — L8990 Pressure ulcer of unspecified site, unspecified stage: Secondary | ICD-10-CM

## 2018-02-12 DIAGNOSIS — I110 Hypertensive heart disease with heart failure: Secondary | ICD-10-CM | POA: Diagnosis present

## 2018-02-12 HISTORY — PX: LEFT HEART CATH AND CORONARY ANGIOGRAPHY: CATH118249

## 2018-02-12 LAB — POCT I-STAT, CHEM 8
BUN: 23 mg/dL — AB (ref 6–20)
CREATININE: 2 mg/dL — AB (ref 0.44–1.00)
Calcium, Ion: 1.13 mmol/L — ABNORMAL LOW (ref 1.15–1.40)
Chloride: 109 mmol/L (ref 98–111)
Glucose, Bld: 129 mg/dL — ABNORMAL HIGH (ref 70–99)
HCT: 41 % (ref 36.0–46.0)
Hemoglobin: 13.9 g/dL (ref 12.0–15.0)
Potassium: 4.2 mmol/L (ref 3.5–5.1)
SODIUM: 143 mmol/L (ref 135–145)
TCO2: 22 mmol/L (ref 22–32)

## 2018-02-12 LAB — CBC
HCT: 43.2 % (ref 36.0–46.0)
HEMOGLOBIN: 13.3 g/dL (ref 12.0–15.0)
MCH: 28.5 pg (ref 26.0–34.0)
MCHC: 30.8 g/dL (ref 30.0–36.0)
MCV: 92.5 fL (ref 78.0–100.0)
Platelets: 65 10*3/uL — ABNORMAL LOW (ref 150–400)
RBC: 4.67 MIL/uL (ref 3.87–5.11)
RDW: 14.6 % (ref 11.5–15.5)
WBC: 10.8 10*3/uL — ABNORMAL HIGH (ref 4.0–10.5)

## 2018-02-12 LAB — POCT I-STAT 3, ART BLOOD GAS (G3+)
ACID-BASE DEFICIT: 11 mmol/L — AB (ref 0.0–2.0)
Acid-base deficit: 16 mmol/L — ABNORMAL HIGH (ref 0.0–2.0)
Bicarbonate: 12.8 mmol/L — ABNORMAL LOW (ref 20.0–28.0)
Bicarbonate: 15.9 mmol/L — ABNORMAL LOW (ref 20.0–28.0)
O2 Saturation: 98 %
O2 Saturation: 100 %
PCO2 ART: 41.9 mmHg (ref 32.0–48.0)
PH ART: 7.227 — AB (ref 7.350–7.450)
PO2 ART: 152 mmHg — AB (ref 83.0–108.0)
TCO2: 14 mmol/L — ABNORMAL LOW (ref 22–32)
TCO2: 17 mmol/L — ABNORMAL LOW (ref 22–32)
pCO2 arterial: 38.7 mmHg (ref 32.0–48.0)
pH, Arterial: 7.094 — CL (ref 7.350–7.450)
pO2, Arterial: 286 mmHg — ABNORMAL HIGH (ref 83.0–108.0)

## 2018-02-12 LAB — COMPREHENSIVE METABOLIC PANEL
ALK PHOS: 73 U/L (ref 38–126)
ALT: 125 U/L — AB (ref 0–44)
AST: 123 U/L — ABNORMAL HIGH (ref 15–41)
Albumin: 2.8 g/dL — ABNORMAL LOW (ref 3.5–5.0)
Anion gap: 17 — ABNORMAL HIGH (ref 5–15)
BUN: 12 mg/dL (ref 6–20)
CALCIUM: 8.5 mg/dL — AB (ref 8.9–10.3)
CO2: 17 mmol/L — ABNORMAL LOW (ref 22–32)
CREATININE: 1.29 mg/dL — AB (ref 0.44–1.00)
Chloride: 110 mmol/L (ref 98–111)
GFR, EST AFRICAN AMERICAN: 52 mL/min — AB (ref >60)
GFR, EST NON AFRICAN AMERICAN: 45 mL/min — AB (ref >60)
Glucose, Bld: 209 mg/dL — ABNORMAL HIGH (ref 70–99)
Potassium: 3.5 mmol/L (ref 3.5–5.1)
Sodium: 144 mmol/L (ref 135–145)
Total Bilirubin: 0.6 mg/dL (ref 0.3–1.2)
Total Protein: 5.4 g/dL — ABNORMAL LOW (ref 6.5–8.1)

## 2018-02-12 LAB — I-STAT CHEM 8, ED
BUN: 14 mg/dL (ref 6–20)
CALCIUM ION: 1.08 mmol/L — AB (ref 1.15–1.40)
CREATININE: 1.2 mg/dL — AB (ref 0.44–1.00)
Chloride: 111 mmol/L (ref 98–111)
GLUCOSE: 201 mg/dL — AB (ref 70–99)
HCT: 40 % (ref 36.0–46.0)
HEMOGLOBIN: 13.6 g/dL (ref 12.0–15.0)
Potassium: 3.4 mmol/L — ABNORMAL LOW (ref 3.5–5.1)
Sodium: 143 mmol/L (ref 135–145)
TCO2: 18 mmol/L — AB (ref 22–32)

## 2018-02-12 LAB — APTT
aPTT: 77 seconds — ABNORMAL HIGH (ref 24–36)
aPTT: >200 seconds (ref 24–36)

## 2018-02-12 LAB — I-STAT BETA HCG BLOOD, ED (MC, WL, AP ONLY)

## 2018-02-12 LAB — LIPID PANEL
CHOL/HDL RATIO: 4.1 ratio
CHOLESTEROL: 138 mg/dL (ref 0–200)
HDL: 34 mg/dL — AB (ref >40)
LDL Cholesterol: 80 mg/dL (ref 0–99)
Triglycerides: 122 mg/dL (ref <150)
VLDL: 24 mg/dL (ref 0–40)

## 2018-02-12 LAB — I-STAT CG4 LACTIC ACID, ED: LACTIC ACID, VENOUS: 9.74 mmol/L — AB (ref 0.5–1.9)

## 2018-02-12 LAB — PROTIME-INR
INR: 1.66
INR: 1.98
PROTHROMBIN TIME: 19.5 s — AB (ref 11.4–15.2)
PROTHROMBIN TIME: 22.3 s — AB (ref 11.4–15.2)

## 2018-02-12 LAB — I-STAT TROPONIN, ED: Troponin i, poc: 0.42 ng/mL (ref 0.00–0.08)

## 2018-02-12 LAB — MRSA PCR SCREENING: MRSA BY PCR: NEGATIVE

## 2018-02-12 LAB — LIPASE, BLOOD: LIPASE: 34 U/L (ref 11–51)

## 2018-02-12 LAB — TROPONIN I: Troponin I: 0.47 ng/mL (ref <0.03)

## 2018-02-12 SURGERY — LEFT HEART CATH AND CORONARY ANGIOGRAPHY
Anesthesia: LOCAL

## 2018-02-12 MED ORDER — FAMOTIDINE IN NACL 20-0.9 MG/50ML-% IV SOLN
20.0000 mg | Freq: Two times a day (BID) | INTRAVENOUS | Status: DC
  Administered 2018-02-12 – 2018-02-13 (×2): 20 mg via INTRAVENOUS
  Filled 2018-02-12 (×2): qty 50

## 2018-02-12 MED ORDER — MIDAZOLAM BOLUS VIA INFUSION
2.0000 mg | INTRAVENOUS | Status: DC | PRN
  Filled 2018-02-12: qty 2

## 2018-02-12 MED ORDER — VERAPAMIL HCL 2.5 MG/ML IV SOLN
INTRAVENOUS | Status: DC | PRN
  Administered 2018-02-12: 10 mL via INTRA_ARTERIAL

## 2018-02-12 MED ORDER — HEPARIN SODIUM (PORCINE) 1000 UNIT/ML IJ SOLN
INTRAMUSCULAR | Status: AC
  Filled 2018-02-12: qty 1

## 2018-02-12 MED ORDER — HEPARIN (PORCINE) IN NACL 100-0.45 UNIT/ML-% IJ SOLN
1000.0000 [IU]/h | INTRAMUSCULAR | Status: DC
  Administered 2018-02-12: 1200 [IU]/h via INTRAVENOUS
  Filled 2018-02-12 (×2): qty 250

## 2018-02-12 MED ORDER — SODIUM CHLORIDE 0.9 % IV SOLN
INTRAVENOUS | Status: DC
  Administered 2018-02-13 (×3): via INTRAVENOUS

## 2018-02-12 MED ORDER — LEVETIRACETAM IN NACL 1500 MG/100ML IV SOLN
1500.0000 mg | Freq: Two times a day (BID) | INTRAVENOUS | Status: DC
  Filled 2018-02-12 (×2): qty 100

## 2018-02-12 MED ORDER — CISATRACURIUM BOLUS VIA INFUSION
0.0500 mg/kg | INTRAVENOUS | Status: DC | PRN
  Filled 2018-02-12: qty 7

## 2018-02-12 MED ORDER — IOPAMIDOL (ISOVUE-370) INJECTION 76%
INTRAVENOUS | Status: AC
  Administered 2018-02-12: 80 mL
  Filled 2018-02-12: qty 100

## 2018-02-12 MED ORDER — ARTIFICIAL TEARS OPHTHALMIC OINT
1.0000 "application " | TOPICAL_OINTMENT | Freq: Three times a day (TID) | OPHTHALMIC | Status: DC
  Administered 2018-02-12 – 2018-02-14 (×5): 1 via OPHTHALMIC
  Filled 2018-02-12 (×2): qty 3.5

## 2018-02-12 MED ORDER — SODIUM CHLORIDE 0.9% FLUSH: 3.0000 mL | INTRAVENOUS | Status: DC | PRN

## 2018-02-12 MED ORDER — ACETAMINOPHEN 325 MG PO TABS: 650.0000 mg | ORAL_TABLET | ORAL | Status: DC | PRN

## 2018-02-12 MED ORDER — NOREPINEPHRINE 4 MG/250ML-% IV SOLN
0.0000 ug/min | INTRAVENOUS | Status: DC
  Filled 2018-02-12: qty 250

## 2018-02-12 MED ORDER — HEPARIN SODIUM (PORCINE) 5000 UNIT/ML IJ SOLN
4000.0000 [IU] | Freq: Once | INTRAMUSCULAR | Status: DC
  Filled 2018-02-12: qty 1

## 2018-02-12 MED ORDER — CISATRACURIUM BOLUS VIA INFUSION
0.1000 mg/kg | Freq: Once | INTRAVENOUS | Status: DC
  Filled 2018-02-12: qty 13

## 2018-02-12 MED ORDER — IOHEXOL 350 MG/ML SOLN
INTRAVENOUS | Status: DC | PRN
  Administered 2018-02-12: 80 mL via INTRA_ARTERIAL

## 2018-02-12 MED ORDER — LIDOCAINE HCL (PF) 1 % IJ SOLN
INTRAMUSCULAR | Status: DC | PRN
  Administered 2018-02-12: 10 mL via SUBCUTANEOUS

## 2018-02-12 MED ORDER — SODIUM CHLORIDE 0.9% FLUSH
3.0000 mL | Freq: Two times a day (BID) | INTRAVENOUS | Status: DC
  Administered 2018-02-12: 3 mL via INTRAVENOUS

## 2018-02-12 MED ORDER — NOREPINEPHRINE 4 MG/250ML-% IV SOLN
0.0000 ug/min | INTRAVENOUS | Status: DC
  Administered 2018-02-12: 5 ug/min via INTRAVENOUS
  Administered 2018-02-13: 45 ug/min via INTRAVENOUS
  Filled 2018-02-12 (×3): qty 250

## 2018-02-12 MED ORDER — LACTATED RINGERS IV BOLUS: 1000.0000 mL | Freq: Once | INTRAVENOUS | Status: DC

## 2018-02-12 MED ORDER — EPINEPHRINE PF 1 MG/10ML IJ SOSY
PREFILLED_SYRINGE | INTRAMUSCULAR | Status: AC | PRN
  Administered 2018-02-12 (×2): 1 mg via INTRAVENOUS

## 2018-02-12 MED ORDER — SODIUM CHLORIDE 0.9 % IV SOLN
1.0000 ug/kg/min | INTRAVENOUS | Status: DC
  Administered 2018-02-12: 1 ug/kg/min via INTRAVENOUS
  Administered 2018-02-13: 1.203 ug/kg/min via INTRAVENOUS
  Filled 2018-02-12 (×2): qty 20

## 2018-02-12 MED ORDER — NITROGLYCERIN 1 MG/10 ML FOR IR/CATH LAB
INTRA_ARTERIAL | Status: AC
  Filled 2018-02-12: qty 10

## 2018-02-12 MED ORDER — HEPARIN SODIUM (PORCINE) 5000 UNIT/ML IJ SOLN: 5000.0000 [IU] | Freq: Three times a day (TID) | INTRAMUSCULAR | Status: DC

## 2018-02-12 MED ORDER — ASPIRIN 300 MG RE SUPP
300.0000 mg | Freq: Once | RECTAL | Status: AC
  Administered 2018-02-12: 300 mg via RECTAL
  Filled 2018-02-12: qty 1

## 2018-02-12 MED ORDER — HEPARIN (PORCINE) IN NACL 1000-0.9 UT/500ML-% IV SOLN
INTRAVENOUS | Status: AC
  Filled 2018-02-12: qty 1000

## 2018-02-12 MED ORDER — FENTANYL CITRATE (PF) 100 MCG/2ML IJ SOLN: 100.0000 ug | Freq: Once | INTRAMUSCULAR | Status: DC

## 2018-02-12 MED ORDER — ONDANSETRON HCL 4 MG/2ML IJ SOLN: 4.0000 mg | Freq: Four times a day (QID) | INTRAMUSCULAR | Status: DC | PRN

## 2018-02-12 MED ORDER — FENTANYL BOLUS VIA INFUSION
50.0000 ug | INTRAVENOUS | Status: DC | PRN
  Filled 2018-02-12: qty 50

## 2018-02-12 MED ORDER — HEPARIN SODIUM (PORCINE) 1000 UNIT/ML IJ SOLN
INTRAMUSCULAR | Status: DC | PRN
  Administered 2018-02-12: 2000 [IU] via INTRAVENOUS

## 2018-02-12 MED ORDER — VALPROATE SODIUM 500 MG/5ML IV SOLN
15.0000 mg/kg/d | Freq: Three times a day (TID) | INTRAVENOUS | Status: DC
  Administered 2018-02-13 – 2018-02-14 (×5): 624 mg via INTRAVENOUS
  Filled 2018-02-12 (×6): qty 6.24

## 2018-02-12 MED ORDER — ASPIRIN 300 MG RE SUPP: 300.0000 mg | RECTAL | Status: AC

## 2018-02-12 MED ORDER — MIDAZOLAM HCL 2 MG/2ML IJ SOLN: 2.0000 mg | Freq: Once | INTRAMUSCULAR | Status: DC

## 2018-02-12 MED ORDER — FENTANYL 2500MCG IN NS 250ML (10MCG/ML) PREMIX INFUSION
100.0000 ug/h | INTRAVENOUS | Status: DC
  Administered 2018-02-12 – 2018-02-13 (×2): 100 ug/h via INTRAVENOUS
  Filled 2018-02-12 (×2): qty 250

## 2018-02-12 MED ORDER — HEPARIN BOLUS VIA INFUSION
4000.0000 [IU] | Freq: Once | INTRAVENOUS | Status: AC
  Administered 2018-02-12: 4000 [IU] via INTRAVENOUS
  Filled 2018-02-12: qty 4000

## 2018-02-12 MED ORDER — VERAPAMIL HCL 2.5 MG/ML IV SOLN
INTRAVENOUS | Status: AC
  Filled 2018-02-12: qty 2

## 2018-02-12 MED ORDER — SODIUM CHLORIDE 0.9 % IV SOLN: 250.0000 mL | INTRAVENOUS | Status: DC | PRN

## 2018-02-12 MED ORDER — SODIUM CHLORIDE 0.9 % IV SOLN: INTRAVENOUS | Status: AC

## 2018-02-12 MED ORDER — HEPARIN (PORCINE) IN NACL 1000-0.9 UT/500ML-% IV SOLN
INTRAVENOUS | Status: DC | PRN
  Administered 2018-02-12 (×2): 500 mL

## 2018-02-12 MED ORDER — SODIUM CHLORIDE 0.9 % IV SOLN
2.0000 mg/h | INTRAVENOUS | Status: DC
  Administered 2018-02-12 – 2018-02-13 (×2): 2 mg/h via INTRAVENOUS
  Filled 2018-02-12 (×3): qty 10

## 2018-02-12 MED ORDER — SODIUM CHLORIDE 0.9 % IV SOLN
INTRAVENOUS | Status: DC
  Administered 2018-02-12: 23:00:00 via INTRAVENOUS

## 2018-02-12 SURGICAL SUPPLY — 13 items
CATH INFINITI 5FR ANG PIGTAIL (CATHETERS) ×2 IMPLANT
CATH OPTITORQUE TIG 4.0 5F (CATHETERS) ×2 IMPLANT
DEVICE RAD COMP TR BAND LRG (VASCULAR PRODUCTS) ×2 IMPLANT
GLIDESHEATH SLEND A-KIT 6F 22G (SHEATH) ×2 IMPLANT
GUIDEWIRE INQWIRE 1.5J.035X260 (WIRE) ×1 IMPLANT
HOVERMATT SINGLE USE (MISCELLANEOUS) ×2 IMPLANT
INQWIRE 1.5J .035X260CM (WIRE) ×2
KIT ENCORE 26 ADVANTAGE (KITS) ×2 IMPLANT
KIT HEART LEFT (KITS) ×2 IMPLANT
PACK CARDIAC CATHETERIZATION (CUSTOM PROCEDURE TRAY) ×2 IMPLANT
SHEATH PROBE COVER 6X72 (BAG) ×2 IMPLANT
TRANSDUCER W/STOPCOCK (MISCELLANEOUS) ×2 IMPLANT
TUBING CIL FLEX 10 FLL-RA (TUBING) ×2 IMPLANT

## 2018-02-12 NOTE — H&P (View-Only) (Signed)
57-year-old African-American female with history of hip replacement, hypertension, no other significant medical comorbidities, no h/o drug abuse, with witnessed cardiac arrest (seen by her sister) around 1 PM on 02/05/2018. No reported bystander CPR. EMS EKG showed inferior ST elevation. EMS performed CPR and gave 3 doses of epinephrine and 1 defibrillation for ventricular tachycardia. EKG in the ED shows sinus rhythm with IVCD, subsequent EKG shows sinus rhythm with right bundle branch block and inferior T-wave inversions, frequent PVCs. Patient is not sedated, currently intubated. B/l pupils pinpoint, intact intermittent respiratory efforts.   Labs are significant for thrombocytopenia platelets 65,000, troponin 0.43, lactic acid 9.   Given concern for acute coronary syndrome causing cardiac arrest, patient is being emergently taken to cath lab for coronary angiography and possible intervention.   Therron Sells J Valerya Maxton, MD [facility] Pager: [phone] Office: [phone] If no answer Cell [phone]   

## 2018-02-12 NOTE — ED Notes (Signed)
Pt transported to cath lab , report given to Cath lab staff

## 2018-02-12 NOTE — Procedures (Addendum)
Chest Tube Insertion Procedure Note  Indications:  Clinically significant Pneumothorax.  Post good pneumothorax or iatrogenic pneumothorax most likely postcode Pneumothorax CPR compressions given the nature of the subcutaneous emphysema and the severity of it immediately after the code Pre-operative Diagnosis: Pneumothorax  Post-operative Diagnosis: Pneumothorax  Procedure Details  Informed consent was obtained for the procedure, including sedation.  Risks of lung perforation, hemorrhage, arrhythmia, and adverse drug reaction were discussed.   After sterile skin prep, using standard technique, a 28 French tube was placed in the left lateral 5th rib space.  Findings: Chest tube was placed in fifth intercostal space switch of air came out bubbling of the chest tube came out  Estimated Blood Loss:  less than 50 mL         Specimens:  None              Complications:  None; patient tolerated the procedure well.         Disposition: ICU - intubated and hemodynamically stable.         Condition: unstable  Attending Attestation: I performed the procedure.  I updated the sisters in the room explained to them about the pneumothorax could be iatrogenic from the cord or the line and the chest tube has been placed.

## 2018-02-12 NOTE — CV Procedure (Signed)
Clean coronaries with slow flow. No obstructive CAD. No ascending aorta dissection.  Full report to follow.  Elder Negus, MD [facility] Pager: [phone] Office: [phone] If no answer Cell [phone]

## 2018-02-12 NOTE — Consult Note (Addendum)
Reason for Consult: Cardiac arrest Referring Physician: [facility]  HILJA KINTZEL is an 57 y.o. female.  HPI:   57 year old African-American female with history of hip replacement, hypertension, no other significant medical comorbidities, no h/o drug abuse, with witnessed cardiac arrest (seen by her sister) around 1 PM on 02/26/2018. Patient was appearing lethargic, which prompted sisters to calls EMS.. Patient reportedly collapsed in front of EMS and received emmediate CPR. EMS EKG showed inferior ST elevation. EMS performed CPR and gave 3 doses of epinephrine and 1 defibrillation for ventricular tachycardia, per the report. EKG in the ED shows sinus rhythm with IVCD, subsequent EKG shows sinus rhythm with right bundle branch block and inferior T-wave inversions, frequent PVCs. Patient is not sedated, currently intubated. B/l pupils pinpoint, intact intermittent respiratory efforts.   Labs are significant for thrombocytopenia platelets 65,000, troponin 0.43, lactic acid 9.   Past Medical History:  Diagnosis Date  . Arthritis   . Hypertension    pt denies an actual diagnois; states "ive been talked to about it bu tnot prescribed any medication "     Past Surgical History:  Procedure Laterality Date  . BREAST LUMPECTOMY     unaware which breast   . TOTAL HIP ARTHROPLASTY Right 08/27/2016   Procedure: RIGHT TOTAL HIP ARTHROPLASTY ANTERIOR APPROACH;  Surgeon: Mcarthur Rossetti, MD;  Location: [facility];  Service: Orthopedics;  Laterality: Right;  . TOTAL HIP ARTHROPLASTY Left 04/01/2017   Procedure: LEFT TOTAL HIP ARTHROPLASTY ANTERIOR APPROACH;  Surgeon: Mcarthur Rossetti, MD;  Location: [facility];  Service: Orthopedics;  Laterality: Left;    Family History  Problem Relation Age of Onset  . Diabetes Father   . Hypertension Father   . Diabetes Mother   . Hypertension Brother     Social History: Limited history. Per the sisters, nonsmoker, occasional alcohol use. No  drugs use.   Allergies: No Known Allergies  Medications: I have reviewed the patient's current medications.  Results for orders placed or performed during the hospital encounter of 02/22/2018 (from the past 48 hour(s))  CBC     Status: Abnormal   Collection Time: 02/06/2018  2:05 PM  Result Value Ref Range   WBC 10.8 (H) 4.0 - 10.5 K/uL   RBC 4.67 3.87 - 5.11 MIL/uL   Hemoglobin 13.3 12.0 - 15.0 g/dL   HCT 43.2 36.0 - 46.0 %   MCV 92.5 78.0 - 100.0 fL   MCH 28.5 26.0 - 34.0 pg   MCHC 30.8 30.0 - 36.0 g/dL   RDW 14.6 11.5 - 15.5 %   Platelets 65 (L) 150 - 400 K/uL    Comment: REPEATED TO VERIFY SPECIMEN CHECKED FOR CLOTS PLATELET COUNT CONFIRMED BY SMEAR Performed at [facility], [place] [address], [place], [place] [address]   Comprehensive metabolic panel     Status: Abnormal   Collection Time: 03/02/2018  2:05 PM  Result Value Ref Range   Sodium 144 135 - 145 mmol/L   Potassium 3.5 3.5 - 5.1 mmol/L   Chloride 110 98 - 111 mmol/L   CO2 17 (L) 22 - 32 mmol/L   Glucose, Bld 209 (H) 70 - 99 mg/dL   BUN 12 6 - 20 mg/dL   Creatinine, Ser 1.29 (H) 0.44 - 1.00 mg/dL   Calcium 8.5 (L) 8.9 - 10.3 mg/dL   Total Protein 5.4 (L) 6.5 - 8.1 g/dL   Albumin 2.8 (L) 3.5 - 5.0 g/dL   AST 123 (H) 15 -  41 U/L   ALT 125 (H) 0 - 44 U/L   Alkaline Phosphatase 73 38 - 126 U/L   Total Bilirubin 0.6 0.3 - 1.2 mg/dL   GFR calc non Af Amer 45 (L) >60 mL/min   GFR calc Af Amer 52 (L) >60 mL/min    Comment: (NOTE) The eGFR has been calculated using the CKD EPI equation. This calculation has not been validated in all clinical situations. eGFR's persistently <60 mL/min signify possible Chronic Kidney Disease.    Anion gap 17 (H) 5 - 15    Comment: Performed at [facility], [place] [address], [place], [place] [address]  Lipase, blood     Status: None   Collection Time: 02/28/2018  2:05 PM  Result Value Ref Range   Lipase 34 11 - 51 U/L    Comment: Performed at [facility]  [address], [place], [place] [address]  Protime-INR     Status: Abnormal   Collection Time: 02/18/2018  2:05 PM  Result Value Ref Range   Prothrombin Time 19.5 (H) 11.4 - 15.2 seconds   INR 1.66     Comment: Performed at [facility] [address], [place], [place] [address]  APTT     Status: Abnormal   Collection Time: 02/27/2018  2:05 PM  Result Value Ref Range   aPTT 77 (H) 24 - 36 seconds    Comment:        IF BASELINE aPTT IS ELEVATED, SUGGEST PATIENT RISK ASSESSMENT BE USED TO DETERMINE APPROPRIATE ANTICOAGULANT THERAPY. Performed at [facility], [place] [address] [place], [place] [address]   Troponin I     Status: Abnormal   Collection Time: 02/23/2018  2:05 PM  Result Value Ref Range   Troponin I 0.47 (HH) <0.03 ng/mL    Comment: CRITICAL RESULT CALLED TO, READ BACK BY AND VERIFIED WITH: Dorise Hiss RN @ [phone] ON 03/03/2018 BY HTEMOCHE Performed at [facility], [place] [address] [place], [place] [address]   Lipid panel     Status: Abnormal   Collection Time: 02/18/2018  2:05 PM  Result Value Ref Range   Cholesterol 138 0 - 200 mg/dL   Triglycerides 122 <150 mg/dL   HDL 34 (L) >40 mg/dL   Total CHOL/HDL Ratio 4.1 RATIO   VLDL 24 0 - 40 mg/dL   LDL Cholesterol 80 0 - 99 mg/dL    Comment:        Total Cholesterol/HDL:CHD Risk Coronary Heart Disease Risk Table                     Men   Women  1/2 Average Risk   3.4   3.3  Average Risk       5.0   4.4  2 X Average Risk   9.6   7.1  3 X Average Risk  23.4   11.0        Use the calculated Patient Ratio above and the CHD Risk Table to determine the patient's CHD Risk.        ATP III CLASSIFICATION (LDL):  <100     mg/dL   Optimal  100-129  mg/dL   Near or Above                    Optimal  130-159  mg/dL   Borderline  160-189  mg/dL   High  >190     mg/dL  Very High Performed at [facility], [place] [address] [place], [place] [address]   I-Stat Troponin, ED (not at [facility])     Status: Abnormal   Collection  Time: 02/19/2018  2:15 PM  Result Value Ref Range   Troponin i, poc 0.42 (HH) 0.00 - 0.08 ng/mL   Comment NOTIFIED PHYSICIAN    Comment 3            Comment: Due to the release kinetics of cTnI, a negative result within the first hours of the onset of symptoms does not rule out myocardial infarction with certainty. If myocardial infarction is still suspected, repeat the test at appropriate intervals.   I-Stat CG4 Lactic Acid, ED     Status: Abnormal   Collection Time: 02/27/2018  2:17 PM  Result Value Ref Range   Lactic Acid, Venous 9.74 (HH) 0.5 - 1.9 mmol/L   Comment NOTIFIED PHYSICIAN   I-Stat Chem 8, ED     Status: Abnormal   Collection Time: 02/15/2018  2:17 PM  Result Value Ref Range   Sodium 143 135 - 145 mmol/L   Potassium 3.4 (L) 3.5 - 5.1 mmol/L   Chloride 111 98 - 111 mmol/L   BUN 14 6 - 20 mg/dL   Creatinine, Ser 1.20 (H) 0.44 - 1.00 mg/dL   Glucose, Bld 201 (H) 70 - 99 mg/dL   Calcium, Ion 1.08 (L) 1.15 - 1.40 mmol/L   TCO2 18 (L) 22 - 32 mmol/L   Hemoglobin 13.6 12.0 - 15.0 g/dL   HCT 40.0 36.0 - 46.0 %  I-Stat beta hCG blood, ED     Status: None   Collection Time: 03/01/2018  2:22 PM  Result Value Ref Range   I-stat hCG, quantitative <5.0 <5 mIU/mL   Comment 3            Comment:   GEST. AGE      CONC.  (mIU/mL)   <=1 WEEK        5 - 50     2 WEEKS       50 - 500     3 WEEKS       100 - 10,000     4 WEEKS     1,000 - 30,000        FEMALE AND NON-PREGNANT FEMALE:     LESS THAN 5 mIU/mL   I-STAT 3, arterial blood gas (G3+)     Status: Abnormal   Collection Time: 02/26/2018  3:16 PM  Result Value Ref Range   pH, Arterial 7.094 (LL) 7.350 - 7.450   pCO2 arterial 41.9 32.0 - 48.0 mmHg   pO2, Arterial 152.0 (H) 83.0 - 108.0 mmHg   Bicarbonate 12.8 (L) 20.0 - 28.0 mmol/L   TCO2 14 (L) 22 - 32 mmol/L   O2 Saturation 98.0 %   Acid-base deficit 16.0 (H) 0.0 - 2.0 mmol/L   Patient temperature HIDE    Sample type ARTERIAL    Comment NOTIFIED PHYSICIAN   MRSA PCR  Screening     Status: None   Collection Time: 02/09/2018  4:14 PM  Result Value Ref Range   MRSA by PCR NEGATIVE NEGATIVE    Comment:        The GeneXpert MRSA Assay (FDA approved for NASAL specimens only), is one component of a comprehensive MRSA colonization surveillance program. It is not intended to diagnose MRSA infection nor to guide or monitor treatment for MRSA infections. Performed at [facility], [address] [place], [place] [address]   I-STAT 3, arterial blood gas (G3+)     Status: Abnormal   Collection Time: 02/09/2018  5:51 PM  Result Value Ref Range   pH, Arterial 7.227 (L) 7.350 - 7.450   pCO2 arterial 38.7 32.0 - 48.0 mmHg   pO2, Arterial 286.0 (H) 83.0 - 108.0 mmHg   Bicarbonate 15.9 (L) 20.0 - 28.0 mmol/L   TCO2 17 (L) 22 - 32 mmol/L   O2 Saturation 100.0 %   Acid-base deficit 11.0 (H) 0.0 - 2.0 mmol/L   Patient temperature 38.0 C    Sample type ARTERIAL     Ct Head Wo Contrast  Result Date: 02/25/2018 CLINICAL DATA:  Cardiac arrest. EXAM: CT HEAD WITHOUT CONTRAST TECHNIQUE: Contiguous axial images were obtained from the base of the skull through the vertex without intravenous contrast. COMPARISON:  None. FINDINGS: Brain: There is mild motion artifact which limits assessment. The ventricles and sulci are not completely effaced but are smaller than expected for patient age, however no prior head imaging is available to determine the patient's baseline cerebral volume. There may be early loss of gray-white differentiation. No focal acute infarct, intracranial hemorrhage, mass, midline shift, or extra-axial fluid collection is identified. Vascular: Intravascular contrast material from cardiac catheterization. Skull: No fracture or focal osseous lesion. Sinuses/Orbits: Visualized paranasal sinuses and mastoid air cells are clear. Orbits are unremarkable. Other: None. IMPRESSION: Finding suspicious for early cerebral edema from diffuse anoxic injury. Electronically  Signed   By: Logan Bores M.D.   On: 02/28/2018 18:54   Ct Angio Chest Pe W Or Wo Contrast  Result Date: 02/22/2018 CLINICAL DATA:  Stopped breathing during a syncopal episode. Clinical concern for pulmonary embolism. EXAM: CT ANGIOGRAPHY CHEST WITH CONTRAST TECHNIQUE: Multidetector CT imaging of the chest was performed using the standard protocol during bolus administration of intravenous contrast. Multiplanar CT image reconstructions and MIPs were obtained to evaluate the vascular anatomy. CONTRAST:  13m ISOVUE-370 IOPAMIDOL (ISOVUE-370) INJECTION 76% COMPARISON:  Portable chest obtained earlier today. FINDINGS: Cardiovascular: Multiple moderately large bilateral pulmonary arterial filling defects. Mildly enlarged heart. The right ventricular to left ventricular ratio is 1.38. The main pulmonary artery segment is enlarged, measuring 3.6 cm in maximum diameter. Mediastinum/Nodes: Nasogastric tube extending into the stomach. Endotracheal tube tip 7 mm above the carina. No enlarged lymph nodes. Unremarkable thyroid gland. No mediastinal shift. Lungs/Pleura: Approximately 50% left pneumothorax. Patchy and confluent opacities in both lungs. These are primarily peripheral and wedge shaped. Upper Abdomen: Reflux of contrast into the hepatic veins. Small amount of free peritoneal fluid. Musculoskeletal: Extensive left lateral subcutaneous emphysema, extending into the left anterior chest and left lower neck. No rib fractures are seen. Thoracic and lower cervical spine degenerative changes. Review of the MIP images confirms the above findings. IMPRESSION: 1. Positive for acute PE with CT evidence of right heart strain (RV/LV Ratio = 1.38) consistent with at least submassive (intermediate risk) PE, although the presence of right heart strain has been associated with an increased risk of morbidity and mortality. Please activate Code PE by paging [phone] 2. Approximately 50% left pneumothorax without mediastinal  shift. 3. Multiple peripheral wedge-shaped areas of patchy and confluent airspace opacity in both lungs. These may represent multiple pulmonary infarcts associated with the bilateral pulmonary emboli. Atelectasis/pneumonia can also have this appearance. 4. Extensive left subcutaneous emphysema, extending into the lower neck on the left. 5. Endotracheal tube tip 7 mm above the carina. It is recommended that this be retracted  3 cm. 6. Enlarged main pulmonary artery. This could be due to the moderately large bilateral pulmonary emboli or pulmonary hypertension. Emphysema (ICD10-J43.9). Critical Value/emergent results were called by telephone at the time of interpretation on 02/18/2018 at 7:02 pm to Dr. Rollene Fare , who verbally acknowledged these results. Electronically Signed   By: Claudie Revering M.D.   On: 02/07/2018 19:08   Dg Chest Port 1 View  Result Date: 02/16/2018 CLINICAL DATA:  Central line placement. EXAM: PORTABLE CHEST 1 VIEW COMPARISON:  02/06/2018. FINDINGS: Endotracheal tube in satisfactory position. Nasogastric tube extending into the stomach. Interval left jugular catheter with its tip in the inferior aspect of the superior vena cava. No pneumothorax. Clear lungs. Thoracic spine degenerative changes. IMPRESSION: Left jugular catheter tip in the inferior aspect of the superior vena cava without pneumothorax. Electronically Signed   By: Claudie Revering M.D.   On: 02/25/2018 18:12   Dg Chest Portable 1 View  Result Date: 02/28/2018 CLINICAL DATA:  Intubation EXAM: PORTABLE CHEST 1 VIEW COMPARISON:  None. FINDINGS: The ETT is in good position. No pneumothorax. The NG tube terminates below today's film. Probable cardiomegaly. The hila and mediastinum are unremarkable given portable technique. No pulmonary nodules or masses. No focal infiltrates. IMPRESSION: 1. Support apparatus as above. 2. The heart and mediastinum are prominent, probably due to portable technique. Recommend attention on follow-up. 3. No  other abnormalities. Electronically Signed   By: Dorise Bullion III M.D   On: 03/06/2018 14:43    Review of Systems  Unable to perform ROS: Patient unresponsive  S/p cardiac arrest  Blood pressure (!) 87/56, pulse (!) 102, temperature 100 F (37.8 C), resp. rate (!) 25, height _0  (1.626 m), weight 124.7 kg, last menstrual period 12/03/2010, SpO2 (!) 81 %. Physical Exam  Nursing note and vitals reviewed. Constitutional:  Morbidly obese patient. Intubated.   Eyes:  B/l pinpoint pupils  Neck: JVD present.  Cardiovascular: Normal rate, regular rhythm and normal heart sounds.  No murmur heard. Respiratory: Effort normal and breath sounds normal.  GI: Soft. Bowel sounds are normal.  Musculoskeletal: She exhibits edema (1+ b/l).  Lymphadenopathy:    She has no cervical adenopathy.  Neurological:  Intubated. Nonresponsive  Skin: Skin is warm and dry.  Psychiatric:  Not assessed   Cardiac studies: EMS EKG showed Sinus rhythm with transient ST elevation in inferior leads.. EKG in ED  Cath 02/09/2018: Proximal LAD 30% lesion. Otherwise, no angiographically significant coronary artery disease. Slow flow in all coronaries, likely due to metabolic acidosis in the setting of cardiac arrest. No ascending aorta dissection.  Assessment/Plan:  57 y/o [place] American female with cardiac arrest, transient ST elevation of EMG EKG EKG in the ED with Sinus rhythm, inferior and anteroseptal TWI, frequent PVC.   Cardiac arrest: No obstructive CAD, no thoracic aorta dissection. Strongly consider PE Ordered CT head after my discussion with PCCM given unresponsiveness. Hypothermia protocol will be initiated in ICU. Further management per PCCM team  Troponin elevation: Mildly elevated, secondary to cardiac arrest.   Manish J Patwardhan 02/07/2018, 7:46 PM   Manish Esther Hardy, MD [facility] [place] Pager: [phone] Office: [phone] If no answer Cell  [phone]

## 2018-02-12 NOTE — ED Notes (Signed)
Heparin push held per cardiology

## 2018-02-12 NOTE — Progress Notes (Signed)
eLink Physician-Brief Progress Note Patient Name: [name] DOB: 02-Oct-1960 MRN: [id number]   Date of Service  03/11/18  HPI/Events of Note  ABG on 100%/PC 10/Rate = 18/P 8 = 6.825/>120/159. Ppeak with PRVC 18/TV 500 = 27.  eICU Interventions  Will order: 1. Change to PRVC 18 and TV 500. 2. ABG at 12:30 AM.     Intervention Category Major Interventions: Acid-Base disturbance - evaluation and management;Respiratory failure - evaluation and management  [name] 2018/03/11, 11:24 PM

## 2018-02-12 NOTE — Progress Notes (Signed)
ANTICOAGULATION CONSULT NOTE - Initial Consult  Pharmacy Consult for heparin Indication: pulmonary embolus  Patient Measurements: Weight: 275 lb (124.7 kg) Heparin Dosing Weight: 75 kg  Vital Signs: Temp: 100 F (37.8 C) (09/08 1700) Temp Source: Temporal (09/08 1416) BP: 87/56 (09/08 1700) Pulse Rate: 102 (09/08 1700)  Labs: Recent Labs    02/27/2018 1405 02/23/2018 1417  HGB 13.3 13.6  HCT 43.2 40.0  PLT 65*  --   APTT 77*  --   LABPROT 19.5*  --   INR 1.66  --   CREATININE 1.29* 1.20*  TROPONINI 0.47*  --      Assessment: Found unresponsive, presented as cardiac arrest. She was taken immediately to the cath lab - which showed clean coronaries. She then had a CTA which was positive for an acute PE with R heart strain. She received heparin 2000 units in cath lab. hgb 13.6, ptls 65, previously normal  Given severity of illness, will still bolus heparin.  Goal of Therapy:  Heparin level 0.3-0.7 units/ml Monitor platelets by anticoagulation protocol: Yes   Plan:  -Heparin bolus 4000 units x1 then 1200 units/hr -Daily HL, CBC -First level with AM labs   Baldemar Friday 03/03/2018,7:05 PM

## 2018-02-12 NOTE — Progress Notes (Signed)
Post CPR assisted in changing king airway to ET tube and placed patient on vent. Then transported to cath lab. No noted respiratory distress upon leaving cath lab.

## 2018-02-12 NOTE — Progress Notes (Signed)
   2018/02/17 1500  Clinical Encounter Type  Visited With Family;Patient  Visit Type Initial  Referral From Nurse  Consult/Referral To Chaplain  Spiritual Encounters  Spiritual Needs Prayer;Emotional  Stress Factors  Family Stress Factors Exhausted   Chaplain responded to trauma C alert. Chaplain offered ministry of presence to PT as PT was being taken to Cath Lab. Also, Chaplain visited sisters in consult B for spiritual care and prayer.

## 2018-02-12 NOTE — Progress Notes (Signed)
57 year old African-American female with history of hip replacement, hypertension, no other significant medical comorbidities, no h/o drug abuse, with witnessed cardiac arrest (seen by her sister) around 1 PM on 05-Mar-2018. No reported bystander CPR. EMS EKG showed inferior ST elevation. EMS performed CPR and gave 3 doses of epinephrine and 1 defibrillation for ventricular tachycardia. EKG in the ED shows sinus rhythm with IVCD, subsequent EKG shows sinus rhythm with right bundle branch block and inferior T-wave inversions, frequent PVCs. Patient is not sedated, currently intubated. B/l pupils pinpoint, intact intermittent respiratory efforts.   Labs are significant for thrombocytopenia platelets 65,000, troponin 0.43, lactic acid 9.   Given concern for acute coronary syndrome causing cardiac arrest, patient is being emergently taken to cath lab for coronary angiography and possible intervention.   Elder Negus, MD [facility] [place] Pager: [phone] Office: [phone] If no answer Cell [phone]

## 2018-02-12 NOTE — Progress Notes (Signed)
RT NOTE:  Critical ABG results reported to [facility] Dr. Dellie Catholic.  PH: 6.825 PCO2: VALUE ABOVE REPORTABLE RANGE PO2: 153 HCO3: 23.7 SO2: 95.9  Vent settings changed per verbal order PRVC VT: 500 RR: 18 PEEP: 8 FIO2: 100   Repeat ABG in 1 hour.

## 2018-02-12 NOTE — Code Documentation (Signed)
BG is 103

## 2018-02-12 NOTE — Procedures (Signed)
Name: [name] MRN: [id number] DOB: 27-Apr-1961  DOS:  PROCEDURE NOTE  Procedure:  Arterial catheter placement.  Indications:  Need for invasive hemodynamic monitoring / frequent arterial blood gases measurement.  Consent:  Consent was implied due to the emergency nature of the procedure.  Procedure summary:  The patient was identified as [name] and safety timeout was performed. Collateral arterial flow was confirmed by performing Allen's test. Sterile technique was used. The patient's left wrist was prepped using chlorhexidine / alcohol scrub and the field was draped in usual sterile fashion with protective barrier. The left radial artery was cannulated witout difficulty. Blood was aspirated and the catheter was flushed with normal saline without difficulty. Good arterial waveform was obtained. The catheter was secured into place with sterile dressing.  Complications:  No immediate complications were noted.  Estimated blood loss:  Less then 5 mL  Melodie Bouillon, M.D. Pulmonary and Critical Care Medicine [facility]   02-19-2018, 6:04 PM

## 2018-02-12 NOTE — ED Provider Notes (Signed)
[facility] CARDIAC CATH LAB Provider Note   CSN: [id number] Arrival date & time: 03/05/2018  1357     History   Chief Complaint Chief Complaint  Patient presents with  . Cardiac Arrest    HPI CHANIKA BYLAND is a 57 y.o. female.  Pt presents to the ED today as a cardiac arrest.  She had a syncopal event in the bathroom.  Her sister was there and found her there.  She was not breathing, but had a pulse.  Sister called 911.  When they arrived, they did a 12 lead ekg which showed ST elevation in the inferior leads.  Shortly after that, she lost her pulse.  EMS placed a king airway.  They gave 3 doses of epi and 1 shock.  Pt still without pulse upon arrival here.  Pt has no cardiac hx.     Past Medical History:  Diagnosis Date  . Arthritis   . Hypertension    pt denies an actual diagnois; states "ive been talked to about it bu tnot prescribed any medication "     Patient Active Problem List   Diagnosis Date Noted  . Cardiac arrest (HCC) 02/16/2018  . Acute coronary syndrome (HCC) 02/11/2018  . Status post total replacement of left hip 04/01/2017  . Pain of left hip joint 01/17/2017  . Trochanteric bursitis, left hip 11/08/2016  . Status post right hip replacement 11/08/2016  . Right knee pain 11/08/2016  . Unilateral primary osteoarthritis, left hip 08/27/2016  . Status post total replacement of right hip 08/27/2016    Past Surgical History:  Procedure Laterality Date  . BREAST LUMPECTOMY     unaware which breast   . TOTAL HIP ARTHROPLASTY Right 08/27/2016   Procedure: RIGHT TOTAL HIP ARTHROPLASTY ANTERIOR APPROACH;  Surgeon: Kathryne Hitch, MD;  Location: [facility];  Service: Orthopedics;  Laterality: Right;  . TOTAL HIP ARTHROPLASTY Left 04/01/2017   Procedure: LEFT TOTAL HIP ARTHROPLASTY ANTERIOR APPROACH;  Surgeon: Kathryne Hitch, MD;  Location: [facility];  Service: Orthopedics;  Laterality: Left;     OB History    Gravida  0   Para      Term      Preterm      AB      Living        SAB      TAB      Ectopic      Multiple      Live Births               Home Medications    Prior to Admission medications   Medication Sig Start Date End Date Taking? Authorizing Provider  amoxicillin (AMOXIL) 500 MG tablet Take 2 by mouth one hour before dental appointment, then 2 by mouth 6 hours after dental appointment. 04/18/17   Kathryne Hitch, MD  aspirin 81 MG chewable tablet Chew 1 tablet (81 mg total) by mouth 2 (two) times daily. 04/02/17   Kathryne Hitch, MD  gabapentin (NEURONTIN) 100 MG capsule Take 1 capsule (100 mg total) by mouth 3 (three) times daily. 04/02/17   Kathryne Hitch, MD  methocarbamol (ROBAXIN) 500 MG tablet Take 1 tablet (500 mg total) every 6 (six) hours as needed by mouth for muscle spasms. 04/18/17   Kathryne Hitch, MD  oxyCODONE-acetaminophen (ROXICET) 5-325 MG tablet Take 1-2 tablets every 4 (four) hours as needed by mouth. 04/18/17   Kathryne Hitch, MD  Family History Family History  Problem Relation Age of Onset  . Diabetes Father   . Hypertension Father   . Diabetes Mother   . Hypertension Brother     Social History Social History   Tobacco Use  . Smoking status: Never Smoker  . Smokeless tobacco: Never Used  Substance Use Topics  . Alcohol use: Yes    Comment: occasionally   . Drug use: No     Allergies   Patient has no known allergies.   Review of Systems Review of Systems  Unable to perform ROS: Patient unresponsive     Physical Exam Updated Vital Signs BP 104/76   Pulse 62   Temp (!) 96.2 F (35.7 C) (Temporal)   Resp 12   Wt 124.7 kg   LMP 12/03/2010   SpO2 100% Comment: Simultaneous filing. User may not have seen previous data.  BMI 47.20 kg/m   Physical Exam  HENT:  Head: Normocephalic.  Right Ear: External ear normal.  Left Ear: External ear normal.  Nose: Nose normal.  King airway  in place  Eyes: Pupils are equal, round, and reactive to light. Conjunctivae and EOM are normal.  Neck: No tracheal deviation present.  Cardiovascular:  No pulse  Pulmonary/Chest:  bs only with bagging.  No spontaneous breaths.  Abdominal: Soft. Bowel sounds are normal.  Musculoskeletal: Normal range of motion.  Neurological: She is unresponsive.  Skin: Skin is warm. Capillary refill takes less than 2 seconds.  Psychiatric:  Unable to assess  Nursing note and vitals reviewed.    ED Treatments / Results  Labs (all labs ordered are listed, but only abnormal results are displayed) Labs Reviewed  CBC - Abnormal; Notable for the following components:      Result Value   WBC 10.8 (*)    Platelets 65 (*)    All other components within normal limits  PROTIME-INR - Abnormal; Notable for the following components:   Prothrombin Time 19.5 (*)    All other components within normal limits  APTT - Abnormal; Notable for the following components:   aPTT 77 (*)    All other components within normal limits  I-STAT CG4 LACTIC ACID, ED - Abnormal; Notable for the following components:   Lactic Acid, Venous 9.74 (*)    All other components within normal limits  I-STAT CHEM 8, ED - Abnormal; Notable for the following components:   Potassium 3.4 (*)    Creatinine, Ser 1.20 (*)    Glucose, Bld 201 (*)    Calcium, Ion 1.08 (*)    TCO2 18 (*)    All other components within normal limits  I-STAT TROPONIN, ED - Abnormal; Notable for the following components:   Troponin i, poc 0.42 (*)    All other components within normal limits  COMPREHENSIVE METABOLIC PANEL  LIPASE, BLOOD  TROPONIN I  LIPID PANEL  I-STAT BETA HCG BLOOD, ED ([facility], [facility], [facility] ONLY)    EKG EKG Interpretation  Date/Time:  Sunday February 12 2018 14:16:51 EDT Ventricular Rate:  67 PR Interval:    QRS Duration: 121 QT Interval:  447 QTC Calculation: 472 R Axis:   96 Text Interpretation:  Sinus or ectopic atrial rhythm  Ventricular trigeminy Short PR interval Nonspecific intraventricular conduction delay Nonspecific repol abnormality, diffuse leads Confirmed by Jacalyn Lefevre [phone]) on 02/23/2018 3:05:44 PM  EKG by EMS:     Radiology Dg Chest Portable 1 View  Result Date: 03/03/2018 CLINICAL DATA:  Intubation EXAM: PORTABLE CHEST 1 VIEW  COMPARISON:  None. FINDINGS: The ETT is in good position. No pneumothorax. The NG tube terminates below today's film. Probable cardiomegaly. The hila and mediastinum are unremarkable given portable technique. No pulmonary nodules or masses. No focal infiltrates. IMPRESSION: 1. Support apparatus as above. 2. The heart and mediastinum are prominent, probably due to portable technique. Recommend attention on follow-up. 3. No other abnormalities. Electronically Signed   By: Gerome Sam III M.D   On: 03/14/2018 14:43    Procedures Procedure Name: Intubation Date/Time: 2018/03/14 3:05 PM Performed by: Jacalyn Lefevre, MD Pre-anesthesia Checklist: Patient identified, Patient being monitored, Emergency Drugs available, Timeout performed and Suction available Oxygen Delivery Method: Ambu bag Preoxygenation: Pre-oxygenation with 100% oxygen Ventilation: Mask ventilation without difficulty Laryngoscope Size: Glidescope and 3 Tube size: 7.5 mm Number of attempts: 1 Placement Confirmation: ETT inserted through vocal cords under direct vision,  Breath sounds checked- equal and bilateral and Positive ETCO2 Tube secured with: ETT holder Dental Injury: Teeth and Oropharynx as per pre-operative assessment       (including critical care time)  Medications Ordered in ED Medications  0.9 %  sodium chloride infusion (has no administration in time range)  heparin injection 4,000 Units ( Intravenous MAR Hold 14-Mar-2018 1453)  EPINEPHrine (ADRENALIN) 1 MG/10ML injection (1 mg Intravenous Given Mar 14, 2018 1403)  aspirin suppository 300 mg (300 mg Rectal Given Mar 14, 2018 1446)     Initial  Impression / Assessment and Plan / ED Course  I have reviewed the triage vital signs and the nursing notes.  Pertinent labs & imaging results that were available during my care of the patient were reviewed by me and considered in my medical decision making (see chart for details).  CRITICAL CARE Performed by: Jacalyn Lefevre   Total critical care time: 30 minutes  Critical care time was exclusive of separately billable procedures and treating other patients.  Critical care was necessary to treat or prevent imminent or life-threatening deterioration.  Critical care was time spent personally by me on the following activities: development of treatment plan with patient and/or surrogate as well as nursing, discussions with consultants, evaluation of patient's response to treatment, examination of patient, obtaining history from patient or surrogate, ordering and performing treatments and interventions, ordering and review of laboratory studies, ordering and review of radiographic studies, pulse oximetry and re-evaluation of patient's condition.   CPR maintained after arrival here.  Pt's king airway removed and ETT placed.  She did not require any meds for intubation.   Pt given 1 additional epi.  At next pulse check, pt did have a pulse.  BP is stable.  She has not required any additional pressors after the epis given in the code.  So far, she has not required any sedation.   Due to ST elevation in EMS EKG, a code stemi was called and she was d/w Dr. [name] Holms (cards) who will take her to the cath lab.  She was given asa PR.  Heparin held.   Final Clinical Impressions(s) / ED Diagnoses   Final diagnoses:  Cardiac arrest [facility])  ST elevation myocardial infarction (STEMI), unspecified artery [facility])    ED Discharge Orders    None       Jacalyn Lefevre, MD 14-Mar-2018 1510

## 2018-02-12 NOTE — Interval H&P Note (Signed)
History and Physical Interval Note:  02-18-2018 3:03 PM  [place] VALECIA HACKERT  has presented today for surgery, with the diagnosis of stemi  The various methods of treatment have been discussed with the patient and family. After consideration of risks, benefits and other options for treatment, the patient has consented to  Procedure(s): LEFT HEART CATH AND CORONARY ANGIOGRAPHY (N/A) Coronary/Graft Acute MI Revascularization (N/A) as a surgical intervention .  The patient's history has been reviewed, patient examined, no change in status, stable for surgery.  I have reviewed the patient's chart and labs.  Questions were answered to the patient's satisfaction.    2016 Appropriate Use Criteria for Coronary Revascularization in Patients With Acute Coronary Syndrome NSTEMI/UA High Risk (TIMI Score 5-7) NSTEMI/Unstable angina, stabilized patient at high risk Link Here: https://powell.info/ Indication:  Revascularization by PCI or CABG of 1 or more arteries in a patient with NSTEMI or unstable angina with Stabilization after presentation High risk for clinical events  A (7) Indication: 16; Score 7     Karina Lenderman J Sade Hollon

## 2018-02-12 NOTE — Progress Notes (Signed)
RT note-Patient received in the Cath Lab, transferred to table, flat, difficult time ventilating, Changed to PC, achieving VT 500+. Continue to monitor.

## 2018-02-12 NOTE — Procedures (Signed)
Name:  [name] MRN:  [id number] DOB:  [birth year]  PROCEDURE NOTE  Procedure:  Central venous catheter placement.  Indications:  Need for intravenous access and hemodynamic monitoring.  Consent:  Consent was implied due to the emergency nature of the procedure.  Anesthesia:  A total of 10 mL of 1% Lidocaine was used for local infiltration anesthesia.  Procedure summary:  Appropriate equipment was assembled.  The patient was identified as [name] and safety timeout was performed. The patient was placed in Trendelenburg position.  Sterile technique was used. The patient's left anterior chest wall was prepped using chlorhexidine / alcohol scrub and the field was draped in usual sterile fashion with full body drape. After the adequate anesthesia was achieved, the Left IJ vein was cannulated with the introducer needle without difficulty. A guide wire was advanced through the introducer needle, which was then withdrawn. A small skin incision was made at the point of wire entry, the dilator was inserted over the guide wire and appropriate dilation was obtained. The dilator was removed and 7 French triple-lumen catheter was advanced over the guide wire, which was then removed.  All ports were aspirated and flushed with normal saline without difficulty. The catheter was secured into place at 19 cm. Antibiotic patch was placed and sterile dressing was applied. Post-procedure chest x-ray was ordered.  Complications:  No immediate complications were noted.  Hemodynamic parameters and oxygenation remained stable throughout the procedure.  Estimated blood loss:  Less then 5 mL.  Ileana Roup, MD Pulmonary and Critical Care Medicine [facility]   02/11/2018, 6:05 PM

## 2018-02-12 NOTE — H&P (Addendum)
Name: [name] MRN: [id number] DOB: 1961/02/25    ADMISSION DATE:  03/03/2018 CONSULTATION DATE: February 12, 2018  REFERRING MD : Cardiology service  CHIEF COMPLAINT: Cardiac arrest  BRIEF PATIENT DESCRIPTION: Brain edema CPR arrest massive PE and tension pneumothorax clean coronaries in the Cardiac cath SIGNIFICANT EVENTS  CPR arrest massive PE large clot burden pneumothorax tension the left side diffuse brain edema.     HISTORY OF PRESENT ILLNESS:  Patient is 57 year old African-American female with morbid PCT presented from the Cath Lab after she had a cardiac arrest at home she was in the bathroom and she had syncope a she would not wake up the family called EMS EMS started CPR we did not have the CPR papers available at this point but the EKG showed that she had ST segment elevation she was she came to this hospital as a code MI went to the Cath Lab patient had clean coronaries with labile blood pressure and she was transferred to this floor this floor patient was intubated unresponsive a wound to CT scan became very hypotensive came up before she got the CT scan we ended up doing a central line and A-line on her and sending her to CT scan and chest and a brain the brain CT showed in retrospect the result of diffuse brain edema the CTA chest showed a large clot burden with left-sided pneumothorax and subcutaneous emphysema and chest tube was placed and patient was started on hypothermia protocol. Patient patient in the ICU is not able to provide any history history was provided by 3 sisters in the bed patient seemed to be having seizures and unresponsive GCS of 3.   PAST MEDICAL HISTORY :   has a past medical history of Arthritis and Hypertension.  has a past surgical history that includes Total hip arthroplasty (Right, 08/27/2016); Breast lumpectomy; and Total hip arthroplasty (Left, 04/01/2017). Prior to Admission medications   Medication Sig Start Date End Date Taking?  Authorizing Provider  amoxicillin (AMOXIL) 500 MG tablet Take 2 by mouth one hour before dental appointment, then 2 by mouth 6 hours after dental appointment. 04/18/17   Kathryne Hitch, MD  aspirin 81 MG chewable tablet Chew 1 tablet (81 mg total) by mouth 2 (two) times daily. 04/02/17   Kathryne Hitch, MD  gabapentin (NEURONTIN) 100 MG capsule Take 1 capsule (100 mg total) by mouth 3 (three) times daily. 04/02/17   Kathryne Hitch, MD  methocarbamol (ROBAXIN) 500 MG tablet Take 1 tablet (500 mg total) every 6 (six) hours as needed by mouth for muscle spasms. 04/18/17   Kathryne Hitch, MD  oxyCODONE-acetaminophen (ROXICET) 5-325 MG tablet Take 1-2 tablets every 4 (four) hours as needed by mouth. 04/18/17   Kathryne Hitch, MD   No Known Allergies  FAMILY HISTORY:  family history includes Diabetes in her father and mother; Hypertension in her brother and father. SOCIAL HISTORY:  reports that she has never smoked. She has never used smokeless tobacco. She reports that she drinks alcohol. She reports that she does not use drugs.  REVIEW OF SYSTEMS:   Unable to obtain due to patient's conditions.  SUBJECTIVE:   VITAL SIGNS: Temp:  [96.2 F (35.7 C)-100 F (37.8 C)] 100 F (37.8 C) (09/08 1700) Pulse Rate:  [62-102] 102 (09/08 1700) Resp:  [9-25] 25 (09/08 1700) BP: (87-107)/(56-76) 87/56 (09/08 1700) SpO2:  [81 %-100 %] 81 % (09/08 1700) FiO2 (%):  [100 %] 100 % (09/08 1510) Weight:  [  124.7 kg] 124.7 kg (09/08 1414)  PHYSICAL EXAMINATION: General: GCS 3 unresponsive seem to be having rhythmic movements of the tongue and face consistent with seizures Neuro: Consistent with seizures HEENT:  atraumatic , no jaundice , dry mucous membranes  Cardiovascular:  Irregular irregular , ESM 2/6 in the aortic area  Lungs:  CTA bilateral , no wheezing or crackles  Abdomen:  Soft lax +BS , no tenderness . Musculoskeletal:  WNL , normal pulses  Skin:  No  rash    Recent Labs  Lab Mar 05, 2018 1405 03/05/2018 1417  NA 144 143  K 3.5 3.4*  CL 110 111  CO2 17*  --   BUN 12 14  CREATININE 1.29* 1.20*  GLUCOSE 209* 201*   Recent Labs  Lab 03/05/18 1405 05-Mar-2018 1417  HGB 13.3 13.6  HCT 43.2 40.0  WBC 10.8*  --   PLT 65*  --    Ct Head Wo Contrast  Result Date: 05-Mar-2018 CLINICAL DATA:  Cardiac arrest. EXAM: CT HEAD WITHOUT CONTRAST TECHNIQUE: Contiguous axial images were obtained from the base of the skull through the vertex without intravenous contrast. COMPARISON:  None. FINDINGS: Brain: There is mild motion artifact which limits assessment. The ventricles and sulci are not completely effaced but are smaller than expected for patient age, however no prior head imaging is available to determine the patient's baseline cerebral volume. There may be early loss of gray-white differentiation. No focal acute infarct, intracranial hemorrhage, mass, midline shift, or extra-axial fluid collection is identified. Vascular: Intravascular contrast material from cardiac catheterization. Skull: No fracture or focal osseous lesion. Sinuses/Orbits: Visualized paranasal sinuses and mastoid air cells are clear. Orbits are unremarkable. Other: None. IMPRESSION: Finding suspicious for early cerebral edema from diffuse anoxic injury. Electronically Signed   By: Sebastian Ache M.D.   On: 03-05-18 18:54   Ct Angio Chest Pe W Or Wo Contrast  Result Date: 03/05/2018 CLINICAL DATA:  Stopped breathing during a syncopal episode. Clinical concern for pulmonary embolism. EXAM: CT ANGIOGRAPHY CHEST WITH CONTRAST TECHNIQUE: Multidetector CT imaging of the chest was performed using the standard protocol during bolus administration of intravenous contrast. Multiplanar CT image reconstructions and MIPs were obtained to evaluate the vascular anatomy. CONTRAST:  80mL ISOVUE-370 IOPAMIDOL (ISOVUE-370) INJECTION 76% COMPARISON:  Portable chest obtained earlier today. FINDINGS:  Cardiovascular: Multiple moderately large bilateral pulmonary arterial filling defects. Mildly enlarged heart. The right ventricular to left ventricular ratio is 1.38. The main pulmonary artery segment is enlarged, measuring 3.6 cm in maximum diameter. Mediastinum/Nodes: Nasogastric tube extending into the stomach. Endotracheal tube tip 7 mm above the carina. No enlarged lymph nodes. Unremarkable thyroid gland. No mediastinal shift. Lungs/Pleura: Approximately 50% left pneumothorax. Patchy and confluent opacities in both lungs. These are primarily peripheral and wedge shaped. Upper Abdomen: Reflux of contrast into the hepatic veins. Small amount of free peritoneal fluid. Musculoskeletal: Extensive left lateral subcutaneous emphysema, extending into the left anterior chest and left lower neck. No rib fractures are seen. Thoracic and lower cervical spine degenerative changes. Review of the MIP images confirms the above findings. IMPRESSION: 1. Positive for acute PE with CT evidence of right heart strain (RV/LV Ratio = 1.38) consistent with at least submassive (intermediate risk) PE, although the presence of right heart strain has been associated with an increased risk of morbidity and mortality. Please activate Code PE by paging [phone]. 2. Approximately 50% left pneumothorax without mediastinal shift. 3. Multiple peripheral wedge-shaped areas of patchy and confluent airspace opacity in both lungs. These  may represent multiple pulmonary infarcts associated with the bilateral pulmonary emboli. Atelectasis/pneumonia can also have this appearance. 4. Extensive left subcutaneous emphysema, extending into the lower neck on the left. 5. Endotracheal tube tip 7 mm above the carina. It is recommended that this be retracted 3 cm. 6. Enlarged main pulmonary artery. This could be due to the moderately large bilateral pulmonary emboli or pulmonary hypertension. Emphysema (ICD10-J43.9). Critical Value/emergent results were  called by telephone at the time of interpretation on 02/19/2018 at 7:02 pm to Dr. Gillermina Phy , who verbally acknowledged these results. Electronically Signed   By: Beckie Salts M.D.   On: 02/19/2018 19:08   Dg Chest Port 1 View  Result Date: 19-Feb-2018 CLINICAL DATA:  Central line placement. EXAM: PORTABLE CHEST 1 VIEW COMPARISON:  02-19-18. FINDINGS: Endotracheal tube in satisfactory position. Nasogastric tube extending into the stomach. Interval left jugular catheter with its tip in the inferior aspect of the superior vena cava. No pneumothorax. Clear lungs. Thoracic spine degenerative changes. IMPRESSION: Left jugular catheter tip in the inferior aspect of the superior vena cava without pneumothorax. Electronically Signed   By: Beckie Salts M.D.   On: 02/19/2018 18:12   Dg Chest Portable 1 View  Result Date: 02/19/2018 CLINICAL DATA:  Intubation EXAM: PORTABLE CHEST 1 VIEW COMPARISON:  None. FINDINGS: The ETT is in good position. No pneumothorax. The NG tube terminates below today's film. Probable cardiomegaly. The hila and mediastinum are unremarkable given portable technique. No pulmonary nodules or masses. No focal infiltrates. IMPRESSION: 1. Support apparatus as above. 2. The heart and mediastinum are prominent, probably due to portable technique. Recommend attention on follow-up. 3. No other abnormalities. Electronically Signed   By: Gerome Sam III M.D   On: February 19, 2018 14:43    ASSESSMENT / PLAN:  --Post cardiac arrest. --Massive PE. --Tension pneumothorax. --Brain anoxia seizures. --Hypertension. --Morbid obesity.   Plan:  --At this point the patient will be started on hypothermia protocol given sepsis critical mental status. --We will start heparin drip patient is too unstable to go for any procedures given her anoxic brain injury PA and now that she is hemodynamic stable and not hypotensive or coding I would avoid any TPA given the risk of conversion on her brain  injury. --He is not a candidate for [place] at this point again given her anoxic brain injury and risk of conversion with TPA. --Brain anoxia seizures we will start her on Versed drip called the neurology consult and start her on Keppra.   --Hypotension stable at this point but today she is dependent on preload we will start her on boluses of fluids. --Discussed the case with neurology will start the patient on Depakote instead of Keppra and they will see the patient today. --Tension pneumothorax chest tube was present most likely due either to code or central line most likely to the code given the timing of it well and the chest tube is in place and they seem to be functioning well. --Patient overall prognosis is really grams given her anoxic brain injury that has been explained to the family critical care time spent in the care of this patient is 58 minutes.  Critical care time is 58 minutes.  Pulmonary and Critical Care Medicine [facility] Pager: [phone]  02/19/2018, 7:56 PM

## 2018-02-12 NOTE — Progress Notes (Signed)
Patient transported to CT and back to 2H13 without any apparent complications.

## 2018-02-12 NOTE — Code Documentation (Signed)
Patient found unresponsive by sister in bathroom. On EMS arrival pt was apneic but had bradycardic rhythm around 40. Patient EKG showed ST elevation. Patient had witnessed cardiac arrest by EMS, CPR 30 min in the field. 3 EPI given and 1 shock for Vtach. CBG 67 in route.

## 2018-02-12 NOTE — Consult Note (Addendum)
NEURO HOSPITALIST CONSULT NOTE   Requestig physician: Dr. Milon Dikes  Reason for Consult: Anoxic brain injury  History obtained from:   Chart    HPI:                                                                                                                                          [name] is an 57 y.o. female with a history of HTN who presented to the hospital on Sunday afternoon after witnessed cardiac arrest in her bathroom at about 1 PM. EKG by EMS showed inferior ST elevation. CPR was performed by EMS. Total downtime is not documented. Initial Cardiology evaluation was consistent with acute coronary syndrome causing cardiac arrest. On initial exam by Cardiology, her pupils were noted to be pinpoint with intermittent respiratory effort. She was taken to the cath lab where patient was noted to have clean coronaries but with labile blood pressure. She remained unresponsive after transfer to the ICU. CT scan showed diffuse brain edema. CTA showed massive PE with a large clot burden. She was started on hypothermia protocol. The patient exhibited intermittent seizure like activity in the ICU. She was started on IV valproic acid and Versed gtt. Paralytic was also started given hypothermia protocol. Neurology was called to evaluate.  Home meds include ASA, Neurontin, Robaxin and oxycodone.   Past Medical History:  Diagnosis Date  . Arthritis   . Hypertension    pt denies an actual diagnois; states "ive been talked to about it bu tnot prescribed any medication "     Past Surgical History:  Procedure Laterality Date  . BREAST LUMPECTOMY     unaware which breast   . TOTAL HIP ARTHROPLASTY Right 08/27/2016   Procedure: RIGHT TOTAL HIP ARTHROPLASTY ANTERIOR APPROACH;  Surgeon: Mcarthur Rossetti, MD;  Location: [facility];  Service: Orthopedics;  Laterality: Right;  . TOTAL HIP ARTHROPLASTY Left 04/01/2017   Procedure: LEFT TOTAL HIP ARTHROPLASTY ANTERIOR  APPROACH;  Surgeon: Mcarthur Rossetti, MD;  Location: [facility];  Service: Orthopedics;  Laterality: Left;    Family History  Problem Relation Age of Onset  . Diabetes Father   . Hypertension Father   . Diabetes Mother   . Hypertension Brother              Social History:  reports that she has never smoked. She has never used smokeless tobacco. She reports that she drinks alcohol. She reports that she does not use drugs.  No Known Allergies  MEDICATIONS:  Prior to Admission:  Medications Prior to Admission  Medication Sig Dispense Refill Last Dose  . amoxicillin (AMOXIL) 500 MG tablet Take 2 by mouth one hour before dental appointment, then 2 by mouth 6 hours after dental appointment. 8 tablet 0   . aspirin 81 MG chewable tablet Chew 1 tablet (81 mg total) by mouth 2 (two) times daily. 30 tablet 0   . gabapentin (NEURONTIN) 100 MG capsule Take 1 capsule (100 mg total) by mouth 3 (three) times daily. 60 capsule 0   . methocarbamol (ROBAXIN) 500 MG tablet Take 1 tablet (500 mg total) every 6 (six) hours as needed by mouth for muscle spasms. 60 tablet 0   . oxyCODONE-acetaminophen (ROXICET) 5-325 MG tablet Take 1-2 tablets every 4 (four) hours as needed by mouth. 60 tablet 0    Scheduled: . artificial tears  1 application Both Eyes Y6E  . aspirin  300 mg Rectal NOW  . chlorhexidine gluconate (MEDLINE KIT)  15 mL Mouth Rinse BID  . cisatracurium  0.1 mg/kg Intravenous Once  . fentaNYL (SUBLIMAZE) injection  100 mcg Intravenous Once  . mouth rinse  15 mL Mouth Rinse 10 times per day  . midazolam  2 mg Intravenous Once  . sodium chloride flush  3 mL Intravenous Q12H   Continuous: . sodium chloride Stopped (02/13/18 0404)  . sodium chloride    . sodium chloride 50 mL/hr at 02/13/18 0500  . cisatracurium (NIMBEX) infusion 1.2 mcg/kg/min (02/13/18 0500)  . famotidine  (PEPCID) IV Stopped (02/07/2018 2355)  . fentaNYL infusion INTRAVENOUS 100 mcg/hr (02/13/18 0500)  . heparin 1,000 Units/hr (02/13/18 0531)  . lactated ringers Stopped (03/03/2018 1905)  . lactated ringers    . midazolam (VERSED) infusion 2 mg/hr (02/13/18 0500)  . norepinephrine (LEVOPHED) Adult infusion 45 mcg/min (02/13/18 0500)  . potassium chloride 10 mEq (02/13/18 0515)  .  sodium bicarbonate (isotonic) infusion in sterile water 100 mL/hr at 02/13/18 0500  . valproate sodium Stopped (02/13/18 0104)     ROS:                                                                                                                                       Unable to obtain due to sedation.    Blood pressure (!) 87/56, pulse (!) 102, temperature 100 F (37.8 C), resp. rate (!) 25, height _0  (1.626 m), weight 124.7 kg, last menstrual period 12/03/2010, SpO2 (!) 81 %.   General Examination:  Physical Exam  HEENT-  [place]/AT   Lungs- Intubated  Extremities- Cool to touch (on hypothermia protocol)   Neurological Examination Sedated and paralyzed. Exam is essentially uninformative.  Mental Status: Sedated and paralyzed without responses to any external stimuli.  Cranial Nerves: No cranial nerve function except for sluggishly reactive pupils in the context of sedative and paralytic use during hypothermia protocol Motor/Sensory: No movement to stimulation x 4.  Deep Tendon Reflexes: Unable to elicit Plantars: Mute bilaterally   Lab Results: Basic Metabolic Panel: Recent Labs  Lab 02/18/2018 1405 02/13/2018 1417  NA 144 143  K 3.5 3.4*  CL 110 111  CO2 17*  --   GLUCOSE 209* 201*  BUN 12 14  CREATININE 1.29* 1.20*  CALCIUM 8.5*  --     CBC: Recent Labs  Lab 02/17/2018 1405 02/22/2018 1417  WBC 10.8*  --   HGB 13.3 13.6  HCT 43.2 40.0  MCV 92.5  --   PLT 65*  --     Cardiac  Enzymes: Recent Labs  Lab 02/17/2018 1405  TROPONINI 0.47*    Lipid Panel: Recent Labs  Lab 03/06/2018 1405  CHOL 138  TRIG 122  HDL 34*  CHOLHDL 4.1  VLDL 24  LDLCALC 80    Imaging: Ct Head Wo Contrast  Result Date: 02/22/2018 CLINICAL DATA:  Cardiac arrest. EXAM: CT HEAD WITHOUT CONTRAST TECHNIQUE: Contiguous axial images were obtained from the base of the skull through the vertex without intravenous contrast. COMPARISON:  None. FINDINGS: Brain: There is mild motion artifact which limits assessment. The ventricles and sulci are not completely effaced but are smaller than expected for patient age, however no prior head imaging is available to determine the patient's baseline cerebral volume. There may be early loss of gray-white differentiation. No focal acute infarct, intracranial hemorrhage, mass, midline shift, or extra-axial fluid collection is identified. Vascular: Intravascular contrast material from cardiac catheterization. Skull: No fracture or focal osseous lesion. Sinuses/Orbits: Visualized paranasal sinuses and mastoid air cells are clear. Orbits are unremarkable. Other: None. IMPRESSION: Finding suspicious for early cerebral edema from diffuse anoxic injury. Electronically Signed   By: Logan Bores M.D.   On: 02/08/2018 18:54   Ct Angio Chest Pe W Or Wo Contrast  Result Date: 02/26/2018 CLINICAL DATA:  Stopped breathing during a syncopal episode. Clinical concern for pulmonary embolism. EXAM: CT ANGIOGRAPHY CHEST WITH CONTRAST TECHNIQUE: Multidetector CT imaging of the chest was performed using the standard protocol during bolus administration of intravenous contrast. Multiplanar CT image reconstructions and MIPs were obtained to evaluate the vascular anatomy. CONTRAST:  46m ISOVUE-370 IOPAMIDOL (ISOVUE-370) INJECTION 76% COMPARISON:  Portable chest obtained earlier today. FINDINGS: Cardiovascular: Multiple moderately large bilateral pulmonary arterial filling defects. Mildly  enlarged heart. The right ventricular to left ventricular ratio is 1.38. The main pulmonary artery segment is enlarged, measuring 3.6 cm in maximum diameter. Mediastinum/Nodes: Nasogastric tube extending into the stomach. Endotracheal tube tip 7 mm above the carina. No enlarged lymph nodes. Unremarkable thyroid gland. No mediastinal shift. Lungs/Pleura: Approximately 50% left pneumothorax. Patchy and confluent opacities in both lungs. These are primarily peripheral and wedge shaped. Upper Abdomen: Reflux of contrast into the hepatic veins. Small amount of free peritoneal fluid. Musculoskeletal: Extensive left lateral subcutaneous emphysema, extending into the left anterior chest and left lower neck. No rib fractures are seen. Thoracic and lower cervical spine degenerative changes. Review of the MIP images confirms the above findings. IMPRESSION: 1. Positive for acute PE with CT evidence of right heart strain (  RV/LV Ratio = 1.38) consistent with at least submassive (intermediate risk) PE, although the presence of right heart strain has been associated with an increased risk of morbidity and mortality. Please activate Code PE by paging [phone]. 2. Approximately 50% left pneumothorax without mediastinal shift. 3. Multiple peripheral wedge-shaped areas of patchy and confluent airspace opacity in both lungs. These may represent multiple pulmonary infarcts associated with the bilateral pulmonary emboli. Atelectasis/pneumonia can also have this appearance. 4. Extensive left subcutaneous emphysema, extending into the lower neck on the left. 5. Endotracheal tube tip 7 mm above the carina. It is recommended that this be retracted 3 cm. 6. Enlarged main pulmonary artery. This could be due to the moderately large bilateral pulmonary emboli or pulmonary hypertension. Emphysema (ICD10-J43.9). Critical Value/emergent results were called by telephone at the time of interpretation on 02/25/2018 at 7:02 pm to Dr. Rollene Fare ,  who verbally acknowledged these results. Electronically Signed   By: Claudie Revering M.D.   On: 02/16/2018 19:08   Dg Chest Port 1 View  Result Date: 02/19/2018 CLINICAL DATA:  Left chest tube placement. EXAM: PORTABLE CHEST 1 VIEW COMPARISON:  Portable chest and chest CTA obtained earlier today. FINDINGS: Interval small caliber left chest tube with its tip near the left lung apex. No pneumothorax seen. Left subcutaneous emphysema. Endotracheal tube in satisfactory position. Nasogastric tube extending into the stomach. The left jugular catheter tip is not visualized, possibly overlying a lead at the level of the proximal superior vena cava. Stable enlarged cardiac silhouette. There is some increased density in the medial aspects of both upper lobes, corresponding to the areas of opacity seen on the CTA. Lower thoracic spine degenerative changes. IMPRESSION: 1. Interval small caliber left chest tube without pneumothorax. 2. Left subcutaneous emphysema. 3. The left jugular catheter appears to been retracted with its tip possibly at the origin of the superior vena cava. This is difficult to determine due to overlying leads. 4. Stable cardiomegaly and interval bilateral upper lobe atelectasis, pneumonia or pulmonary infarcts. Electronically Signed   By: Claudie Revering M.D.   On: 02/07/2018 20:03   Dg Chest Port 1 View  Result Date: 02/16/2018 CLINICAL DATA:  Central line placement. EXAM: PORTABLE CHEST 1 VIEW COMPARISON:  02/15/2018. FINDINGS: Endotracheal tube in satisfactory position. Nasogastric tube extending into the stomach. Interval left jugular catheter with its tip in the inferior aspect of the superior vena cava. No pneumothorax. Clear lungs. Thoracic spine degenerative changes. IMPRESSION: Left jugular catheter tip in the inferior aspect of the superior vena cava without pneumothorax. Electronically Signed   By: Claudie Revering M.D.   On: 03/04/2018 18:12   Dg Chest Portable 1 View  Result Date:  03/05/2018 CLINICAL DATA:  Intubation EXAM: PORTABLE CHEST 1 VIEW COMPARISON:  None. FINDINGS: The ETT is in good position. No pneumothorax. The NG tube terminates below today's film. Probable cardiomegaly. The hila and mediastinum are unremarkable given portable technique. No pulmonary nodules or masses. No focal infiltrates. IMPRESSION: 1. Support apparatus as above. 2. The heart and mediastinum are prominent, probably due to portable technique. Recommend attention on follow-up. 3. No other abnormalities. Electronically Signed   By: Dorise Bullion III M.D   On: 03/01/2018 14:43    Assessment: 57 year old female with probable anoxic brain injury s/p cardiac arrest 1. Neurological exam currently uninformative as sedated and paralyzed for hypothermia protocol 2. No documentation in notes regarding time down. One note states "no CPR papers" accompanied the patient 3. CT head: Findings  suspicious for early cerebral edema from diffuse anoxic injury  Recommendations: 1. Repeat neurological examination 24 hours after rewarming, on Tuesday morning. Unable to prognosticate this early in hospital course. Discussed with family at the bedside.  2. EEG 3. Continue valproic acid.  4. MRI brain when stable and if able to be accommodated by the scanner given morbid obesity  40 minutes spent in the neurological evaluation and management of this critically ill patient  Electronically signed: Dr. Kerney Elbe 02/22/2018, 8:15 PM

## 2018-02-12 NOTE — Progress Notes (Signed)
RT note- post ABG 

## 2018-02-13 ENCOUNTER — Inpatient Hospital Stay (HOSPITAL_COMMUNITY): Payer: BLUE CROSS/BLUE SHIELD

## 2018-02-13 ENCOUNTER — Other Ambulatory Visit: Payer: Self-pay

## 2018-02-13 ENCOUNTER — Encounter (HOSPITAL_COMMUNITY): Payer: Self-pay | Admitting: Cardiology

## 2018-02-13 DIAGNOSIS — Z9911 Dependence on respirator [ventilator] status: Secondary | ICD-10-CM

## 2018-02-13 DIAGNOSIS — R570 Cardiogenic shock: Secondary | ICD-10-CM

## 2018-02-13 DIAGNOSIS — I2699 Other pulmonary embolism without acute cor pulmonale: Principal | ICD-10-CM

## 2018-02-13 LAB — BASIC METABOLIC PANEL
ANION GAP: 14 (ref 5–15)
ANION GAP: 15 (ref 5–15)
Anion gap: 17 — ABNORMAL HIGH (ref 5–15)
BUN: 23 mg/dL — ABNORMAL HIGH (ref 6–20)
BUN: 24 mg/dL — ABNORMAL HIGH (ref 6–20)
BUN: 26 mg/dL — AB (ref 6–20)
CALCIUM: 6.8 mg/dL — AB (ref 8.9–10.3)
CALCIUM: 6.7 mg/dL — AB (ref 8.9–10.3)
CHLORIDE: 110 mmol/L (ref 98–111)
CHLORIDE: 110 mmol/L (ref 98–111)
CO2: 19 mmol/L — ABNORMAL LOW (ref 22–32)
CO2: 18 mmol/L — ABNORMAL LOW (ref 22–32)
CO2: 18 mmol/L — ABNORMAL LOW (ref 22–32)
CREATININE: 2.47 mg/dL — AB (ref 0.44–1.00)
CREATININE: 2.67 mg/dL — AB (ref 0.44–1.00)
CREATININE: 2.89 mg/dL — AB (ref 0.44–1.00)
Calcium: 6.5 mg/dL — ABNORMAL LOW (ref 8.9–10.3)
Chloride: 105 mmol/L (ref 98–111)
GFR calc Af Amer: 20 mL/min — ABNORMAL LOW (ref >60)
GFR calc non Af Amer: 20 mL/min — ABNORMAL LOW (ref >60)
GFR calc non Af Amer: 19 mL/min — ABNORMAL LOW (ref >60)
GFR, EST AFRICAN AMERICAN: 24 mL/min — AB (ref >60)
GFR, EST AFRICAN AMERICAN: 22 mL/min — AB (ref >60)
GFR, EST NON AFRICAN AMERICAN: 17 mL/min — AB (ref >60)
Glucose, Bld: 203 mg/dL — ABNORMAL HIGH (ref 70–99)
Glucose, Bld: 151 mg/dL — ABNORMAL HIGH (ref 70–99)
Glucose, Bld: 173 mg/dL — ABNORMAL HIGH (ref 70–99)
Potassium: 4.8 mmol/L (ref 3.5–5.1)
Potassium: 5 mmol/L (ref 3.5–5.1)
Potassium: 4.7 mmol/L (ref 3.5–5.1)
SODIUM: 143 mmol/L (ref 135–145)
SODIUM: 140 mmol/L (ref 135–145)
Sodium: 143 mmol/L (ref 135–145)

## 2018-02-13 LAB — POCT I-STAT 3, ART BLOOD GAS (G3+)
ACID-BASE DEFICIT: 10 mmol/L — AB (ref 0.0–2.0)
ACID-BASE DEFICIT: 13 mmol/L — AB (ref 0.0–2.0)
ACID-BASE DEFICIT: 6 mmol/L — AB (ref 0.0–2.0)
ACID-BASE DEFICIT: 7 mmol/L — AB (ref 0.0–2.0)
Acid-base deficit: 13 mmol/L — ABNORMAL HIGH (ref 0.0–2.0)
Acid-base deficit: 9 mmol/L — ABNORMAL HIGH (ref 0.0–2.0)
BICARBONATE: 19.7 mmol/L — AB (ref 20.0–28.0)
BICARBONATE: 20.1 mmol/L (ref 20.0–28.0)
BICARBONATE: 20.2 mmol/L (ref 20.0–28.0)
BICARBONATE: 20.7 mmol/L (ref 20.0–28.0)
BICARBONATE: 21 mmol/L (ref 20.0–28.0)
BICARBONATE: 23.6 mmol/L (ref 20.0–28.0)
O2 SAT: 92 %
O2 SAT: 93 %
O2 SAT: 94 %
O2 SAT: 95 %
O2 SAT: 99 %
O2 Saturation: 95 %
PCO2 ART: 44.6 mmHg (ref 32.0–48.0)
PCO2 ART: 59.4 mmHg — AB (ref 32.0–48.0)
PCO2 ART: 84.2 mmHg — AB (ref 32.0–48.0)
PH ART: 7.222 — AB (ref 7.350–7.450)
PO2 ART: 109 mmHg — AB (ref 83.0–108.0)
PO2 ART: 245 mmHg — AB (ref 83.0–108.0)
PO2 ART: 61 mmHg — AB (ref 83.0–108.0)
PO2 ART: 70 mmHg — AB (ref 83.0–108.0)
Patient temperature: 31.9
Patient temperature: 36.2
Patient temperature: 36.9
TCO2: 21 mmol/L — ABNORMAL LOW (ref 22–32)
TCO2: 22 mmol/L (ref 22–32)
TCO2: 23 mmol/L (ref 22–32)
TCO2: 23 mmol/L (ref 22–32)
TCO2: 23 mmol/L (ref 22–32)
TCO2: 25 mmol/L (ref 22–32)
pCO2 arterial: 40.8 mmHg (ref 32.0–48.0)
pCO2 arterial: 56.1 mmHg — ABNORMAL HIGH (ref 32.0–48.0)
pCO2 arterial: 80.7 mmHg (ref 32.0–48.0)
pH, Arterial: 6.985 — CL (ref 7.350–7.450)
pH, Arterial: 7.001 — CL (ref 7.350–7.450)
pH, Arterial: 7.155 — CL (ref 7.350–7.450)
pH, Arterial: 7.213 — ABNORMAL LOW (ref 7.350–7.450)
pH, Arterial: 7.292 — ABNORMAL LOW (ref 7.350–7.450)
pO2, Arterial: 67 mmHg — ABNORMAL LOW (ref 83.0–108.0)
pO2, Arterial: 92 mmHg (ref 83.0–108.0)

## 2018-02-13 LAB — POCT I-STAT, CHEM 8
BUN: 20 mg/dL (ref 6–20)
BUN: 27 mg/dL — AB (ref 6–20)
CALCIUM ION: 0.87 mmol/L — AB (ref 1.15–1.40)
CHLORIDE: 110 mmol/L (ref 98–111)
CHLORIDE: 113 mmol/L — AB (ref 98–111)
CREATININE: 1.8 mg/dL — AB (ref 0.44–1.00)
CREATININE: 2 mg/dL — AB (ref 0.44–1.00)
Calcium, Ion: 1.11 mmol/L — ABNORMAL LOW (ref 1.15–1.40)
GLUCOSE: 178 mg/dL — AB (ref 70–99)
GLUCOSE: 239 mg/dL — AB (ref 70–99)
HCT: 43 % (ref 36.0–46.0)
HEMATOCRIT: 33 % — AB (ref 36.0–46.0)
Hemoglobin: 11.2 g/dL — ABNORMAL LOW (ref 12.0–15.0)
Hemoglobin: 14.6 g/dL (ref 12.0–15.0)
POTASSIUM: 4 mmol/L (ref 3.5–5.1)
Potassium: 3.1 mmol/L — ABNORMAL LOW (ref 3.5–5.1)
Sodium: 142 mmol/L (ref 135–145)
Sodium: 149 mmol/L — ABNORMAL HIGH (ref 135–145)
TCO2: 21 mmol/L — ABNORMAL LOW (ref 22–32)
TCO2: 22 mmol/L (ref 22–32)

## 2018-02-13 LAB — CBC WITH DIFFERENTIAL/PLATELET
BASOS PCT: 1 %
Basophils Absolute: 0.2 10*3/uL — ABNORMAL HIGH (ref 0.0–0.1)
EOS PCT: 0 %
Eosinophils Absolute: 0 10*3/uL (ref 0.0–0.7)
HEMATOCRIT: 47.4 % — AB (ref 36.0–46.0)
HEMOGLOBIN: 15.1 g/dL — AB (ref 12.0–15.0)
Lymphocytes Relative: 19 %
Lymphs Abs: 4.1 10*3/uL — ABNORMAL HIGH (ref 0.7–4.0)
MCH: 28.2 pg (ref 26.0–34.0)
MCHC: 31.9 g/dL (ref 30.0–36.0)
MCV: 88.4 fL (ref 78.0–100.0)
MONO ABS: 0.9 10*3/uL (ref 0.1–1.0)
MONOS PCT: 4 %
NEUTROS PCT: 76 %
Neutro Abs: 16.2 10*3/uL — ABNORMAL HIGH (ref 1.7–7.7)
Platelets: 82 10*3/uL — ABNORMAL LOW (ref 150–400)
RBC: 5.36 MIL/uL — ABNORMAL HIGH (ref 3.87–5.11)
RDW: 14.6 % (ref 11.5–15.5)
WBC: 21.4 10*3/uL — ABNORMAL HIGH (ref 4.0–10.5)

## 2018-02-13 LAB — COMPREHENSIVE METABOLIC PANEL
ALBUMIN: 2.2 g/dL — AB (ref 3.5–5.0)
ALK PHOS: 77 U/L (ref 38–126)
ALT: 456 U/L — AB (ref 0–44)
AST: 831 U/L — AB (ref 15–41)
Anion gap: 14 (ref 5–15)
BILIRUBIN TOTAL: 0.8 mg/dL (ref 0.3–1.2)
BUN: 22 mg/dL — AB (ref 6–20)
CALCIUM: 6.8 mg/dL — AB (ref 8.9–10.3)
CO2: 19 mmol/L — ABNORMAL LOW (ref 22–32)
CREATININE: 2.46 mg/dL — AB (ref 0.44–1.00)
Chloride: 111 mmol/L (ref 98–111)
GFR calc Af Amer: 24 mL/min — ABNORMAL LOW (ref >60)
GFR, EST NON AFRICAN AMERICAN: 21 mL/min — AB (ref >60)
GLUCOSE: 202 mg/dL — AB (ref 70–99)
Potassium: 4 mmol/L (ref 3.5–5.1)
Sodium: 144 mmol/L (ref 135–145)
TOTAL PROTEIN: 4.5 g/dL — AB (ref 6.5–8.1)

## 2018-02-13 LAB — TROPONIN I
TROPONIN I: 20.3 ng/mL — AB (ref <0.03)
TROPONIN I: 19.49 ng/mL — AB (ref <0.03)
Troponin I: 16.57 ng/mL (ref <0.03)
Troponin I: 14.12 ng/mL (ref <0.03)

## 2018-02-13 LAB — BLOOD GAS, ARTERIAL
Acid-base deficit: 8.1 mmol/L — ABNORMAL HIGH (ref 0.0–2.0)
Bicarbonate: 18.1 mmol/L — ABNORMAL LOW (ref 20.0–28.0)
DRAWN BY: 511851
FIO2: 80
MECHVT: 500 mL
O2 SAT: 96.8 %
PATIENT TEMPERATURE: 98.6
PCO2 ART: 44.8 mmHg (ref 32.0–48.0)
PEEP: 10 cmH2O
PH ART: 7.23 — AB (ref 7.350–7.450)
PO2 ART: 100 mmHg (ref 83.0–108.0)
RATE: 34 resp/min

## 2018-02-13 LAB — GLUCOSE, CAPILLARY
GLUCOSE-CAPILLARY: 136 mg/dL — AB (ref 70–99)
GLUCOSE-CAPILLARY: 79 mg/dL (ref 70–99)
Glucose-Capillary: 182 mg/dL — ABNORMAL HIGH (ref 70–99)
Glucose-Capillary: 181 mg/dL — ABNORMAL HIGH (ref 70–99)
Glucose-Capillary: 148 mg/dL — ABNORMAL HIGH (ref 70–99)
Glucose-Capillary: 145 mg/dL — ABNORMAL HIGH (ref 70–99)

## 2018-02-13 LAB — APTT: aPTT: >200 seconds (ref 24–36)

## 2018-02-13 LAB — HIV ANTIBODY (ROUTINE TESTING W REFLEX): HIV SCREEN 4TH GENERATION: NONREACTIVE

## 2018-02-13 LAB — MAGNESIUM: MAGNESIUM: 2 mg/dL (ref 1.7–2.4)

## 2018-02-13 LAB — PROTIME-INR
INR: 1.92
Prothrombin Time: 21.8 seconds — ABNORMAL HIGH (ref 11.4–15.2)

## 2018-02-13 LAB — HEPARIN LEVEL (UNFRACTIONATED)
HEPARIN UNFRACTIONATED: 1.38 [IU]/mL — AB (ref 0.30–0.70)
HEPARIN UNFRACTIONATED: 1.1 [IU]/mL — AB (ref 0.30–0.70)
HEPARIN UNFRACTIONATED: 0.68 [IU]/mL (ref 0.30–0.70)
HEPARIN UNFRACTIONATED: 0.95 [IU]/mL — AB (ref 0.30–0.70)

## 2018-02-13 LAB — LACTIC ACID, PLASMA
Lactic Acid, Venous: 6.3 mmol/L (ref 0.5–1.9)
Lactic Acid, Venous: 5.8 mmol/L (ref 0.5–1.9)

## 2018-02-13 MED ORDER — HEPARIN (PORCINE) IN NACL 100-0.45 UNIT/ML-% IJ SOLN
700.0000 [IU]/h | INTRAMUSCULAR | Status: DC
  Administered 2018-02-13: 700 [IU]/h via INTRAVENOUS

## 2018-02-13 MED ORDER — VASOPRESSIN 20 UNIT/ML IV SOLN
0.0300 [IU]/min | INTRAVENOUS | Status: DC
  Administered 2018-02-13: 0.03 [IU]/min via INTRAVENOUS
  Filled 2018-02-13: qty 2

## 2018-02-13 MED ORDER — SODIUM CHLORIDE 0.9 % IR SOLN
30.0000 mL | Freq: Three times a day (TID) | Status: DC
  Administered 2018-02-13 – 2018-02-14 (×2): 30 mL

## 2018-02-13 MED ORDER — CHLORHEXIDINE GLUCONATE CLOTH 2 % EX PADS
6.0000 | MEDICATED_PAD | Freq: Every day | CUTANEOUS | Status: DC
  Administered 2018-02-13: 6 via TOPICAL

## 2018-02-13 MED ORDER — SODIUM BICARBONATE 8.4 % IV SOLN
INTRAVENOUS | Status: AC
  Filled 2018-02-13: qty 150

## 2018-02-13 MED ORDER — INSULIN ASPART 100 UNIT/ML ~~LOC~~ SOLN
1.0000 [IU] | SUBCUTANEOUS | Status: DC
  Administered 2018-02-13 (×2): 1 [IU] via SUBCUTANEOUS
  Administered 2018-02-14: 2 [IU] via SUBCUTANEOUS

## 2018-02-13 MED ORDER — SODIUM CHLORIDE 0.9% FLUSH
10.0000 mL | Freq: Two times a day (BID) | INTRAVENOUS | Status: DC
  Administered 2018-02-13: 10 mL

## 2018-02-13 MED ORDER — SODIUM CHLORIDE 0.9% FLUSH: 10.0000 mL | INTRAVENOUS | Status: DC | PRN

## 2018-02-13 MED ORDER — POTASSIUM CHLORIDE 10 MEQ/50ML IV SOLN
10.0000 meq | INTRAVENOUS | Status: AC
  Administered 2018-02-13 (×4): 10 meq via INTRAVENOUS
  Filled 2018-02-13 (×4): qty 50

## 2018-02-13 MED ORDER — SODIUM CHLORIDE 0.9 % IV SOLN
3.0000 g | Freq: Three times a day (TID) | INTRAVENOUS | Status: DC
  Administered 2018-02-13 (×2): 3 g via INTRAVENOUS
  Filled 2018-02-13 (×3): qty 3

## 2018-02-13 MED ORDER — SODIUM BICARBONATE 8.4 % IV SOLN
200.0000 meq | Freq: Once | INTRAVENOUS | Status: AC
  Filled 2018-02-13: qty 50

## 2018-02-13 MED ORDER — FAMOTIDINE IN NACL 20-0.9 MG/50ML-% IV SOLN: 20.0000 mg | INTRAVENOUS | Status: DC

## 2018-02-13 MED ORDER — INFLUENZA VAC SPLIT QUAD 0.5 ML IM SUSY: 0.5000 mL | PREFILLED_SYRINGE | INTRAMUSCULAR | Status: DC | PRN

## 2018-02-13 MED ORDER — SODIUM CHLORIDE 0.9 % IV SOLN
3.0000 g | Freq: Two times a day (BID) | INTRAVENOUS | Status: DC
  Filled 2018-02-13: qty 3

## 2018-02-13 MED ORDER — NOREPINEPHRINE 16 MG/250ML-% IV SOLN
0.0000 ug/min | INTRAVENOUS | Status: DC
  Administered 2018-02-13: 35 ug/min via INTRAVENOUS
  Administered 2018-02-13 (×2): 45 ug/min via INTRAVENOUS
  Administered 2018-02-13: 50 ug/min via INTRAVENOUS
  Administered 2018-02-14: 80 ug/min via INTRAVENOUS
  Administered 2018-02-14: 70 ug/min via INTRAVENOUS
  Filled 2018-02-13 (×5): qty 250

## 2018-02-13 MED ORDER — STERILE WATER FOR INJECTION IV SOLN
INTRAVENOUS | Status: DC
  Administered 2018-02-13 (×2): via INTRAVENOUS
  Filled 2018-02-13 (×6): qty 850

## 2018-02-13 MED ORDER — ORAL CARE MOUTH RINSE
15.0000 mL | OROMUCOSAL | Status: DC
  Administered 2018-02-13 – 2018-02-14 (×13): 15 mL via OROMUCOSAL

## 2018-02-13 MED ORDER — CHLORHEXIDINE GLUCONATE 0.12% ORAL RINSE (MEDLINE KIT)
15.0000 mL | Freq: Two times a day (BID) | OROMUCOSAL | Status: DC
  Administered 2018-02-13 (×3): 15 mL via OROMUCOSAL

## 2018-02-13 MED FILL — Medication: Qty: 1 | Status: AC

## 2018-02-13 MED FILL — Nitroglycerin IV Soln 100 MCG/ML in D5W: INTRA_ARTERIAL | Qty: 10 | Status: AC

## 2018-02-13 NOTE — Progress Notes (Signed)
ANTICOAGULATION CONSULT NOTE   Pharmacy Consult for heparin Indication: pulmonary embolus  Patient Measurements: Height: 5\' 4"  (162.6 cm) Weight: 274 lb 14.6 oz (124.7 kg) IBW/kg (Calculated) : 54.7 Heparin Dosing Weight: 75 kg  Vital Signs: Temp: 91.6 F (33.1 C) (09/09 1600) Temp Source: Bladder (09/09 1600) BP: 102/72 (09/09 1615) Pulse Rate: 59 (09/09 1520)  Labs: Recent Labs    03-12-2018 1405  03-12-2018 2107  02/13/18 0024 02/13/18 0221 02/13/18 0416 02/13/18 0800 02/13/18 1106 02/13/18 1430  HGB 13.3   < >  --   --  14.6 11.2* 15.1*  --   --   --   HCT 43.2   < >  --   --  43.0 33.0* 47.4*  --   --   --   PLT 65*  --   --   --   --   --  82*  --   --   --   APTT 77*  --  >200*  --   --   --  >200*  --   --  >200*  LABPROT 19.5*  --  22.3*  --   --   --  21.8*  --   --   --   INR 1.66  --  1.98  --   --   --  1.92  --   --   --   HEPARINUNFRC  --   --   --   --   --   --  0.95*  --  1.38* 1.10*  CREATININE 1.29*   < >  --   --  2.00* 1.80* 2.46* 2.47* 2.67*  --   TROPONINI 0.47*  --   --    < >  --   --  20.30* 19.49* 14.12*  --    < > = values in this interval not displayed.   Assessment: Found unresponsive, presented as cardiac arrest. She was taken immediately to the cath lab - which showed clean coronaries. She then had a CTA which was positive for an acute PE with R heart strain.  Heparin level was drawn from arterial line (prior to removal) and heparin infusion is running through R peripheral. Both heparin level (1.1) and aPTT (>200) remain elevated. Not on anticoagulation prior to admission. Total bilirudin WNL, AST 831, ALT 456. INR elevated at 1.92. Heparin has been stopped since 1404. No s/sx of bleeding. No infusion issues. Hgb 15.1, plt 82.   Goal of Therapy:  Heparin level 0.3-0.7 units/ml Monitor platelets by anticoagulation protocol: Yes   Plan:  -Restart heparin drip at reduced dose of 700 units/hr (has been on hold for two hours) -Check heparin  level in 6 hours -Daily HL, CBC  Girard Cooter, PharmD Clinical Pharmacist  Pager: [phone] Phone: [phone] 02/13/2018,4:28 PM

## 2018-02-13 NOTE — Progress Notes (Signed)
eLink Physician-Brief Progress Note Patient Name: CONNA SHAH DOB: 1961-05-24 MRN: [id number]   Date of Service  02/13/2018  HPI/Events of Note  K+ = 3.1 and Creatinine = 1.8.  eICU Interventions  Will replace K+.     Intervention Category Major Interventions: Electrolyte abnormality - evaluation and management  Hudson Majkowski [place] 02/13/2018, 3:26 AM

## 2018-02-13 NOTE — Progress Notes (Signed)
CRITICAL VALUE ALERT  Critical Value:  PH 7.001   PCO2 = 80.7   PO2 = 109   CO2 = 23   HCO3 = 20.2   SO2 = 95  Date & Time Notied:  02/13/18 1:11 AM  Provider Notified: Reginal Lutes and spoke w/Gretchen RN.... Will relay message to Dr. Emmit Alexanders  Orders Received/Actions taken: Will call back with further instructions  Also notified them that we have not met 33 degrees w/in the 4 hours. Started at 2020 and pt is at 35.6 degrees at the moment.

## 2018-02-13 NOTE — Progress Notes (Signed)
Responded to pg from staff for family support.  Introduced myself to family and inquired if I could be of spiritual support.  Family gathered at bedside, showing support and listening spiritual music.  They thanked me for my offer, but that chaplain was not needed at this time. Offered to come back as needed- family thanked chaplain for stopping by.

## 2018-02-13 NOTE — Progress Notes (Signed)
CRITICAL VALUE ALERT  Critical Value:  Lactic acid 6.3  Date & Time Notied:  02/13/2018 10:55 AM  Provider Notified: N/A  Orders Received/Actions taken: trending down, on bicarb gtt

## 2018-02-13 NOTE — Progress Notes (Signed)
CRITICAL VALUE ALERT  Critical Value:  PTT >200  Date & Time Notied:  02/13/2018 3:40 PM  Provider Notified: Leota Sauers, [facility], [place]  Orders Received/Actions taken: no new orders given, will continue to monitor.

## 2018-02-13 NOTE — Plan of Care (Signed)
Third sample to lab for 1600 BMET(hemolysis). Dr. Tonia Brooms aware.

## 2018-02-13 NOTE — Procedures (Signed)
ELECTROENCEPHALOGRAM REPORT   Patient: [name]       Room #: 2H13C EEG No. ID: [id number] Age: 57 y.o.        Sex: female Referring Physician: Icard Report Date:  02/13/2018        Interpreting Physician: Thana Farr  History: [name] is an 57 y.o. female s/p arrest  Medications:  Unasyn, ASA, Nimbex, Fentanyl, Insulin, Versed, Depakote, Heparin, Levophed  Conditions of Recording:  This is a 21 channel routine scalp EEG performed with bipolar and monopolar montages arranged in accordance to the international 10/20 system of electrode placement. One channel was dedicated to EKG recording.  The patient is in the intubated, sedated and paralyzed state.  Description:  There is no cerebral activity noted during the recording.  Recording was performed at the standard 7uV/mm.  There was no increase in sensitivity used to acquire the tracing.  There was no tactile or painful stimulation provided during the recording. No epileptiform activity is noted.   Hyperventilation and intermittent photic stimulation were not performed.  IMPRESSION: This is an abnormal electroencephalogram secondary to the absence of cerebral activity.  No epileptiform activity is noted.     Thana Farr, MD Neurology [phone] 02/13/2018, 1:39 PM

## 2018-02-13 NOTE — Progress Notes (Signed)
eLink Physician-Brief Progress Note Patient Name: [name] DOB: 1960/12/22 MRN: [id number]   Date of Service  02/13/2018  HPI/Events of Note  Multiple issues: 1. ABG on 70%/PRVC 28/TV 500/P 8 = 7.21/56.1/67.0.  eICU Interventions  Will order: 1. Decrease NaCl IV infusion rate  To 50 mL/hour. 2. Increase NaHCO3 IV infusion rate to 100 mL/hour.  3. Repeat ABG at 5 AM.     Intervention Category Major Interventions: Acid-Base disturbance - evaluation and management;Respiratory failure - evaluation and management  Hagan Maltz Eugene 02/13/2018, 3:29 AM

## 2018-02-13 NOTE — Progress Notes (Signed)
ANTICOAGULATION CONSULT NOTE   Pharmacy Consult for heparin Indication: pulmonary embolus  Patient Measurements: Height: 5\' 4"  (162.6 cm) Weight: 275 lb (124.7 kg) IBW/kg (Calculated) : 54.7 Heparin Dosing Weight: 75 kg  Vital Signs: Temp: 90.9 F (32.7 C) (09/09 0400) Temp Source: Core (09/09 0400) BP: 133/83 (09/09 0425) Pulse Rate: 52 (09/09 0425)  Labs: Recent Labs    2018/02/27 1405  2018-02-27 2044 02-27-18 2107 2018/02/27 2226 02/13/18 0024 02/13/18 0221 02/13/18 0416  HGB 13.3   < > 13.9  --   --  14.6 11.2* 15.1*  HCT 43.2   < > 41.0  --   --  43.0 33.0* 47.4*  PLT 65*  --   --   --   --   --   --  82*  APTT 77*  --   --  >200*  --   --   --   --   LABPROT 19.5*  --   --  22.3*  --   --   --   --   INR 1.66  --   --  1.98  --   --   --   --   HEPARINUNFRC  --   --   --   --   --   --   --  0.95*  CREATININE 1.29*   < > 2.00*  --   --  2.00* 1.80*  --   TROPONINI 0.47*  --   --   --  16.57*  --   --   --    < > = values in this interval not displayed.     Assessment: Found unresponsive, presented as cardiac arrest. She was taken immediately to the cath lab - which showed clean coronaries. She then had a CTA which was positive for an acute PE with R heart strain. Initial heparin level 0.95 units/ml, PTLC 82, Hg 15.1 Goal of Therapy:  Heparin level 0.3-0.7 units/ml Monitor platelets by anticoagulation protocol: Yes   Plan:  - Decrease heparin drip to 1000 units/hr -Check heparin level in 6-8 hours -Daily HL, CBC   Yasemin Rabon Poteet 02/13/2018,5:22 AM

## 2018-02-13 NOTE — Progress Notes (Signed)
eLink Physician-Brief Progress Note Patient Name: KHRYSTEN GARFINKLE DOB: 1960-11-18 MRN: [id number]   Date of Service  02/13/2018  HPI/Events of Note  ABG on 100%/PRVC 18/tV 500/P 8 = 7.00/80.7/109/23.  eICU Interventions  Will order: 1. Increase ventilator rate to 28. 2. NaHCO3 200 meq IV now.  3. NaHCO3 IV infusion to run at 50 mL/hour. 4. Repeat ABG at 2:30 AM.     Intervention Category Major Interventions: Acid-Base disturbance - evaluation and management;Respiratory failure - evaluation and management  Zhanna Melin Eugene 02/13/2018, 1:17 AM

## 2018-02-13 NOTE — Progress Notes (Signed)
Called E-Link and spoke w/Gretchen RN.   Notified them of K= 3.1 at 0221 on 02/13/18   Sts she will have Dr. Arsenio Loader review results and put in order for potassium.

## 2018-02-13 NOTE — Progress Notes (Signed)
Bedside EEG complete; results pending. 

## 2018-02-13 NOTE — Progress Notes (Signed)
eLink Physician-Brief Progress Note Patient Name: [name] DOB: [birth year] MRN: [id number]   Date of Service  02/13/2018  HPI/Events of Note  Hypotension - BP = 57/37.  eICU Interventions  Will order: 1. Increase ceiling on Norepinephrine IV infusion to 80 mcg/min. 2. ABG STAT.     Intervention Category Major Interventions: Hypotension - evaluation and management  Alick Lecomte Eugene 02/13/2018, 10:32 PM

## 2018-02-13 NOTE — Progress Notes (Addendum)
[name]  WUJ:[id number] DOB: Mar 27, 1961 DOA: 02/24/2018 PCP: Maxie Better, MD    LOS: 1 day   Reason for Consult / Chief Complaint:  Cardiac arrest  Consulting MD and date:  EDP  HPI/Summary of hospital stay:  57 year old female patient admitted on 9/8 following witnessed cardiac arrest after syncopal episode.  Intubated in the field,Time to return of spontaneous circulation estimated at greater than 12 minutes given CPR report.  Was rushed to Cath Lab, found to have clean coronary arteries.  Following this CT head and chest were obtained.  CT chest demonstrated massive pulmonary embolus, also pneumothorax with tension appearance.  CT brain demonstrated diffuse brain edema.  She was admitted to the intensive care and hypothermia protocol was initiated. 9/9: Seen by neurology.  Felt CT imaging consistent with early anoxic injury.  In refractory shock, high-dose epinephrine.  Having trouble oxygenating patient requiring high PEEP/FiO2.  Remains acidemic with a mix of both respiratory and metabolic component ventilator changes made. Aline removed from left arm as hand cold.   Subjective:  Unresponsive on the ventilator   Objective   Blood pressure 109/79, pulse 72, temperature (Abnormal) 92.3 F (33.5 C), temperature source Bladder, resp. rate (Abnormal) 28, height 5\' 4"  (1.626 m), weight 124.7 kg, last menstrual period 12/03/2010, SpO2 96 %. CVP:  [12 mmHg-26 mmHg] 21 mmHg  Vent Mode: PRVC FiO2 (%):  [70 %-100 %] 80 % Set Rate:  [16 bmp-28 bmp] 28 bmp Vt Set:  [500 mL-550 mL] 500 mL PEEP:  [5 cmH20-8 cmH20] 8 cmH20 Plateau Pressure:  [16 cmH20-28 cmH20] 27 cmH20   Intake/Output Summary (Last 24 hours) at 02/13/2018 1112 Last data filed at 02/13/2018 1100 Gross per 24 hour  Intake 4954.33 ml  Output 57 ml  Net 4897.33 ml   Filed Weights   02/28/2018 1414 02/13/18 0500  Weight: 124.7 kg 124.7 kg    Examination: General: Obese 57 year old African-American female  currently on hypothermia protocol and unresponsive HENT: Neck is large mucous membranes moist orally intubated Lungs: Coarse diminished both bases Cardiovascular: Regular rate and rhythm Abdomen: Obese Extremities: Warm, strong pulses. Neuro: Heavily sedated and comatose on neuromuscular blockade and sedating drips GU: Clear yellow urine  Consults: date of consult/date signed off & final recs:  Neurology consulted 9/8:  Procedures: Endotracheal tube 9/8 Left IJ catheter 9/8 Left chest tube anteriorly placed 9/8   Significant Diagnostic Tests: CT head 9/8: Early cerebral edema CT chest 9/8: Acute pulmonary emboli with evidence of right heart strain RV/LV ratio 1.38.  Also found approximately 50% left pneumothorax.  Multiple peripheral wedge-shaped areas of patchy and confluent airspace disease in both lungs likely representing pulmonary infarct.  There is extensive left subcutaneous air ECHO 9/9>>> LE [place] 9/9>>>  Micro Data: MRSA PCR: neg  Sputum culture 9/9:>>>  Antimicrobials:  Unasyn 9/9>>>  Resolved Hospital Problem list    Assessment & Plan:   Cardiac arrest in the setting of massive pulmonary emboli now in circulatory shock.  Suspect cardiogenic possibly RV failure complicated by metabolic acidosis.  Sepsis seems unlikely however cannot exclude aspiration event -Coronary arteries cleared -Determined not a TPA candidate due to pneumothorax and need for chest tube and concern about anoxic brain injury -Heparin started 9/8 Plan Continuing IV heparin Continue telemetry monitoring CVP monitoring Norepinephrine for mean arterial pressure greater than 65 Echocardiogram LE [place]   Acute hypoxic and hypercarbic respiratory failure following cardiopulmonary arrest and pulmonary emboli Remains ventilator dependent due to anoxic brain injury  Possible aspiration pneumonia Portable chest x-ray personally reviewed 9/9: Tracheal tubes in satisfactory position.  Left chest tube in  satisfactory position.  Right greater than left airspace disease.  Still has significant left-sided subcutaneous air.  No obvious pneumothorax.  The left central line appears to be coiled back on self -Continues to have significant respiratory acidosis Plan Continuing full ventilator support Minute ventilation increased PEEP increased to 10, I will hold here and work on titrating oxygen.  If RV is indeed dysfunctional increasing PEEP may exacerbate hemodynamics further Serial arterial blood gas VAP interventions Sputum culture  Start Unasyn  Left-sided tension pneumothorax.  Unclear if this is due to CPR or central line placement Chest tube placed Plan Keep chest tube currently at 20 cm suction Serial chest x-rays  Anoxic brain injury following prolonged cardiac arrest Plan Follow-up EEG Rewarming to start around 4 AM this morning Serial neuro exams Switch to PAD protocol following rewarming phase Will likely need MRI Appreciate neurology  Anion gap metabolic acidosis: Lactic acidosis: I think this is secondary to cardiac arrest.  Lactic acid clearing slowly.  Actually has more of a respiratory acidosis at this point than metabolic Plan Continuing bicarbonate infusion to keep pH greater than 7.2 Mean arterial pressure goal greater than 65 No need to keep trending lactic acid  Acute kidney injury. -Creatinine holding around 2.46 Plan Avoid nephrotoxins Serial chemistries Strict intake output  Borderline hyperkalemia Plan Serial blood chemistries May need Kayexalate  Leukocytosis Suspect reactive Plan Continue to trend CBC Sputum culture  Thrombocytopenia Plan Trending CBC  Cold left hand.  Already on heparin  Plan Dc a line on left hand  May need to check doppler flow after removal   Hyperglycemia Plan Sliding scale insulin  Disposition / Summary of Today's Plan 02/13/18   She is in refractory shock following cardiac arrest due to pulmonary emboli.   Think the etiology of her shock is primarily right ventricular failure, further complicated by her acidemia.  She could have had an aspiration pneumonia, right side of chest looks a little worse today and white cells above 20.  She is critically ill.  Goal for today is to adjust minute ventilation on ventilator, continue bicarbonate drip ultimately to keep pH greater than 7.2.  Start antibiotics.  Obtain sputum culture.  Continue to titrate norepinephrine hopefully if this is all from the pulmonary emboli continuing the heparin over the next 4872 hours hemodynamic should stabilize.  I am still very worried about the extent of her neurological injury given extended downtime and it initial CT imaging.  EEG is pending.  I suspect will need to get an MRI once the hypothermia protocol has been completed.  We will initiate rewarming around 4 AM this morning.  Best Practice / Goals of Care / Disposition.   DVT prophylaxis: Therapeutic heparin for PE GI prophylaxis: Pepcid Diet: N.p.o. Mobility: Bedrest Code Status: Full code Family Communication sisters updated at bedside.  I have tried to prepare them that prognosis looks very poor and that there is a high risk of a devastating brain injury.  At this point the patient is a full code, but I have encouraged her sisters to start to talk to the family, and start to discuss planning for if things were to deteriorate or if indeed we have a irreversible brain injury  Labs   CBC: Recent Labs  Lab 2018-02-23 1405 2018/02/23 1417 23-Feb-2018 2044 02/13/18 0024 02/13/18 0221 02/13/18 0416  WBC 10.8*  --   --   --   --  21.4*  NEUTROABS  --   --   --   --   --  16.2*  HGB 13.3 13.6 13.9 14.6 11.2* 15.1*  HCT 43.2 40.0 41.0 43.0 33.0* 47.4*  MCV 92.5  --   --   --   --  88.4  PLT 65*  --   --   --   --  82*   Basic Metabolic Panel: Recent Labs  Lab 02/19/2018 1405  02/09/2018 2044 02/13/18 0024 02/13/18 0221 02/13/18 0416 02/13/18 0800  NA 144   < > 143 142  149* 144 143  K 3.5   < > 4.2 4.0 3.1* 4.0 4.8  CL 110   < > 109 110 113* 111 110  CO2 17*  --   --   --   --  19* 19*  GLUCOSE 209*   < > 129* 239* 178* 202* 203*  BUN 12   < > 23* 27* 20 22* 23*  CREATININE 1.29*   < > 2.00* 2.00* 1.80* 2.46* 2.47*  CALCIUM 8.5*  --   --   --   --  6.8* 6.8*  MG  --   --   --   --   --  2.0  --    < > = values in this interval not displayed.   GFR: Estimated Creatinine Clearance: 32.4 mL/min (A) (by C-G formula based on SCr of 2.47 mg/dL (H)). Recent Labs  Lab 02/15/2018 1405 02/11/2018 1417 02/13/18 0416 02/13/18 0946  WBC 10.8*  --  21.4*  --   LATICACIDVEN  --  9.74*  --  6.3*   Liver Function Tests: Recent Labs  Lab 02/26/2018 1405 02/13/18 0416  AST 123* 831*  ALT 125* 456*  ALKPHOS 73 77  BILITOT 0.6 0.8  PROT 5.4* 4.5*  ALBUMIN 2.8* 2.2*   Recent Labs  Lab 02/25/2018 1405  LIPASE 34   No results for input(s): AMMONIA in the last 168 hours. ABG    Component Value Date/Time   PHART 7.222 (L) 02/13/2018 0523   PCO2ART 44.6 02/13/2018 0523   PO2ART 61.0 (L) 02/13/2018 0523   HCO3 19.7 (L) 02/13/2018 0523   TCO2 21 (L) 02/13/2018 0523   ACIDBASEDEF 10.0 (H) 02/13/2018 0523   O2SAT 93.0 02/13/2018 0523    Coagulation Profile: Recent Labs  Lab 02/22/2018 1405 02/10/2018 2107 02/13/18 0416  INR 1.66 1.98 1.92   Cardiac Enzymes: Recent Labs  Lab 02/07/2018 1405 03/04/2018 2226 02/13/18 0416 02/13/18 0800  TROPONINI 0.47* 16.57* 20.30* 19.49*   HbA1C: No results found for: HGBA1C CBG: Recent Labs  Lab 02/13/18 0425 02/13/18 0629 02/13/18 0758  GLUCAP 182* 181* 148*    Simonne Martinet ACNP-BC [facility] Pulmonary/Critical Care Pager # [phone] OR # [phone] if no answer

## 2018-02-13 NOTE — Plan of Care (Signed)
CRITICAL VALUE ALERT  Critical Value:  Lactic acid 5.9  Date & Time Notied:  02/13/2017  1200   Provider Notified: Tanja Port PA  Orders Received/Actions taken: orders received

## 2018-02-13 NOTE — Progress Notes (Signed)
Clinically worse.  Refractory shock, now bradycardic.  Had been hyperkalemic before.  Worried that this may be progressing currently awaiting chemistry. Plan DC hypothermia given refractory shock Follow-up chemistry worried about hyperkalemia Discussed with family will not offer CPR should she suffer cardiac arrest as this would be futile care given the degree of support she is already on  Simonne Martinet ACNP-BC [facility] Pulmonary/Critical Care Pager # [phone] OR # [phone] if no answer

## 2018-02-14 ENCOUNTER — Inpatient Hospital Stay (HOSPITAL_COMMUNITY): Payer: BLUE CROSS/BLUE SHIELD

## 2018-02-14 DIAGNOSIS — I2609 Other pulmonary embolism with acute cor pulmonale: Secondary | ICD-10-CM

## 2018-02-14 DIAGNOSIS — L8990 Pressure ulcer of unspecified site, unspecified stage: Secondary | ICD-10-CM

## 2018-02-14 LAB — BASIC METABOLIC PANEL
Anion gap: 16 — ABNORMAL HIGH (ref 5–15)
Anion gap: 18 — ABNORMAL HIGH (ref 5–15)
BUN: 27 mg/dL — AB (ref 6–20)
BUN: 29 mg/dL — ABNORMAL HIGH (ref 6–20)
CHLORIDE: 102 mmol/L (ref 98–111)
CO2: 22 mmol/L (ref 22–32)
CO2: 21 mmol/L — AB (ref 22–32)
CREATININE: 3.12 mg/dL — AB (ref 0.44–1.00)
Calcium: 6.5 mg/dL — ABNORMAL LOW (ref 8.9–10.3)
Calcium: 6.5 mg/dL — ABNORMAL LOW (ref 8.9–10.3)
Chloride: 103 mmol/L (ref 98–111)
Creatinine, Ser: 3.4 mg/dL — ABNORMAL HIGH (ref 0.44–1.00)
GFR calc non Af Amer: 14 mL/min — ABNORMAL LOW (ref >60)
GFR calc non Af Amer: 15 mL/min — ABNORMAL LOW (ref >60)
GFR, EST AFRICAN AMERICAN: 18 mL/min — AB (ref >60)
GFR, EST AFRICAN AMERICAN: 16 mL/min — AB (ref >60)
Glucose, Bld: 140 mg/dL — ABNORMAL HIGH (ref 70–99)
Glucose, Bld: 172 mg/dL — ABNORMAL HIGH (ref 70–99)
POTASSIUM: 5.1 mmol/L (ref 3.5–5.1)
Potassium: 5.4 mmol/L — ABNORMAL HIGH (ref 3.5–5.1)
Sodium: 140 mmol/L (ref 135–145)
Sodium: 142 mmol/L (ref 135–145)

## 2018-02-14 LAB — CBC
HCT: 45.7 % (ref 36.0–46.0)
HEMOGLOBIN: 14.9 g/dL (ref 12.0–15.0)
MCH: 28.3 pg (ref 26.0–34.0)
MCHC: 32.6 g/dL (ref 30.0–36.0)
MCV: 86.7 fL (ref 78.0–100.0)
Platelets: 59 10*3/uL — ABNORMAL LOW (ref 150–400)
RBC: 5.27 MIL/uL — ABNORMAL HIGH (ref 3.87–5.11)
RDW: 15 % (ref 11.5–15.5)
WBC: 17.9 10*3/uL — ABNORMAL HIGH (ref 4.0–10.5)

## 2018-02-14 LAB — GLUCOSE, CAPILLARY: GLUCOSE-CAPILLARY: 131 mg/dL — AB (ref 70–99)

## 2018-02-14 LAB — POCT I-STAT 3, ART BLOOD GAS (G3+)
ACID-BASE DEFICIT: 7 mmol/L — AB (ref 0.0–2.0)
BICARBONATE: 22.1 mmol/L (ref 20.0–28.0)
O2 Saturation: 90 %
PH ART: 7.183 — AB (ref 7.350–7.450)
TCO2: 24 mmol/L (ref 22–32)
pCO2 arterial: 58.7 mmHg — ABNORMAL HIGH (ref 32.0–48.0)
pO2, Arterial: 73 mmHg — ABNORMAL LOW (ref 83.0–108.0)

## 2018-02-14 LAB — HEPARIN LEVEL (UNFRACTIONATED): HEPARIN UNFRACTIONATED: 0.52 [IU]/mL (ref 0.30–0.70)

## 2018-02-14 LAB — APTT

## 2018-02-14 MED ORDER — LORAZEPAM BOLUS VIA INFUSION
2.0000 mg | INTRAVENOUS | Status: DC | PRN
  Filled 2018-02-14: qty 5

## 2018-02-14 MED ORDER — MORPHINE BOLUS VIA INFUSION
5.0000 mg | INTRAVENOUS | Status: DC | PRN
  Filled 2018-02-14: qty 20

## 2018-02-14 MED ORDER — SODIUM BICARBONATE 8.4 % IV SOLN: 100.0000 meq | Freq: Once | INTRAVENOUS | Status: DC

## 2018-02-14 MED ORDER — LORAZEPAM 2 MG/ML IJ SOLN
5.0000 mg/h | INTRAVENOUS | Status: DC
  Filled 2018-02-14: qty 25

## 2018-02-14 MED ORDER — MORPHINE 100MG IN NS 100ML (1MG/ML) PREMIX INFUSION
10.0000 mg/h | INTRAVENOUS | Status: DC
  Filled 2018-02-14: qty 100

## 2018-02-14 MED ORDER — PHENYLEPHRINE HCL-NACL 40-0.9 MG/250ML-% IV SOLN
0.0000 ug/min | INTRAVENOUS | Status: DC
  Filled 2018-02-14: qty 250

## 2018-02-14 MED ORDER — MORPHINE 100MG IN NS 100ML (1MG/ML) PREMIX INFUSION
1.0000 mg/h | INTRAVENOUS | Status: DC
  Administered 2018-02-14: 1 mg/h via INTRAVENOUS
  Filled 2018-02-14: qty 100

## 2018-02-15 LAB — CULTURE, RESPIRATORY: CULTURE: NORMAL

## 2018-02-15 LAB — CULTURE, RESPIRATORY W GRAM STAIN

## 2018-02-16 ENCOUNTER — Telehealth: Payer: Self-pay

## 2018-02-16 NOTE — Telephone Encounter (Signed)
On 02/16/18 I received a d/c from [employer].  The d/c is for cremation.   The patient is a patient of Doctor Icard.  The d/c will be taken to Pulmonary Unit @ [facility] for signature.

## 2018-02-17 LAB — BLOOD GAS, ARTERIAL

## 2018-02-17 NOTE — Telephone Encounter (Signed)
02/17/18 I received DC back from Dr. Tonia BroomsIcard. Called [employer] to pick up. Faxed them a copy to [phone]. PWR

## 2018-03-07 NOTE — Significant Event (Signed)
.. ..    Name: [name] MRN: [id number] DOB: December 01, 1960    ADMISSION DATE:  02/22/2018  57 yr old female s/p cardiac arrest after a syncopal episode found to have a massive PR. Subsequent left sided pneumothorax.  S/p intubation and chest tube placement. CTH shows diffuse edema. Hypothermia protocol was discontinued given refractory shock. Despite maximum pressors and aggressive medical intervention pt's hemodynamic status worsened.  Asked to see pt by RN. Evaluated Ms. [name] and reviewed her chart. Discussed with family ( brother) regarding her clinical condition and goals of care.  Plan: Family has been notified and is on their way Pt has been started on 1mg /hr morphine ggt and current meds continued until family arrives.  Once family members are present they ask that we begin withdrawal of care. Withdrawal of care includes discontinuing all medications including vasoactive drips and  Terminal extubation of the patient  GOAL OF CARE: COMFORT/WITHDRAWL CC TIME:45 MINS PROGNOSIS: POOR FAMILY: BROTHER AT BEDSIDE WITH WIFE, OTHER FAMILY EN ROUTE   Signed Dr Newell Coral Pulmonary Critical Care Locums   03/08/2018, 6:23 AM

## 2018-03-07 NOTE — Progress Notes (Signed)
ANTICOAGULATION CONSULT NOTE - Follow Up Consult  Pharmacy Consult for heparin Indication: pulmonary embolus  Labs: Recent Labs    22-Feb-2018 1405  02-22-2018 2107  02/13/18 0024 02/13/18 0221  02/13/18 0416 02/13/18 0800 02/13/18 1106 02/13/18 1430 02/13/18 1615 02/13/18 1815 02/13/18 2332  HGB 13.3   < >  --   --  14.6 11.2*  --  15.1*  --   --   --   --   --   --   HCT 43.2   < >  --   --  43.0 33.0*  --  47.4*  --   --   --   --   --   --   PLT 65*  --   --   --   --   --   --  82*  --   --   --   --   --   --   APTT 77*  --  >200*  --   --   --   --  >200*  --   --  >200*  --   --   --   LABPROT 19.5*  --  22.3*  --   --   --   --  21.8*  --   --   --   --   --   --   INR 1.66  --  1.98  --   --   --   --  1.92  --   --   --   --   --   --   HEPARINUNFRC  --   --   --   --   --   --    < > 0.95*  --  1.38* 1.10*  --   --  0.68  CREATININE 1.29*   < >  --   --  2.00* 1.80*  --  2.46* 2.47* 2.67*  --  SPECIMEN HEMOLYZED. HEMOLYSIS MAY AFFECT INTEGRITY OF RESULTS. 2.89* 3.12*  TROPONINI 0.47*  --   --    < >  --   --   --  20.30* 19.49* 14.12*  --   --   --   --    < > = values in this interval not displayed.    Assessment/Plan:  57yo female therapeutic on heparin after rate changes; at upper end of goal but pt is rewarming and expect heparin requirements will increase. Will continue gtt at current rate and confirm stable with am labs.   Vernard Gambles, PharmD, BCPS  02/21/2018,12:06 AM

## 2018-03-07 NOTE — Procedures (Signed)
Extubation Procedure Note  Patient Details:   Name: LAURALEI TROUGHTON DOB: 18-Apr-1961 MRN: [id number]   Airway Documentation:    Vent end date: 02-26-2018 Vent end time: 0750   Evaluation  O2 sats: Patient not on monitor, palliative now Complications: Complications of one way terminal extubation Patient did tolerate procedure well. Bilateral Breath Sounds: Diminished   No   Patient terminally extubated per family wishes. Family is at bedside. RT will continue to monitor.   Sanora Cunanan Lajuana Ripple Feb 26, 2018, 7:53 AM

## 2018-03-07 NOTE — Progress Notes (Signed)
RN called CDS with time of death. Patient may be a tissue or eye donor candidate if family is willing. Body has been prepared and is being trransported to morgue. 1ml of morphine, 69ml of versed, and 100 ml of fentanyl have been wasted down the sink and witnessed by Dub Amis RN. A full bag of versed (unused/sealed) has been returned to the pharmacist.

## 2018-03-07 NOTE — Progress Notes (Signed)
eLink Physician-Brief Progress Note Patient Name: [name] DOB: 27-Aug-1960 MRN: [id number]   Date of Service  02/10/2018  HPI/Events of Note  Hypotension - BP = 83/25.   eICU Interventions  Will order: 1. Phenylephrine IV infusion. Titrate to MAP >= 65. 2. NaHCO3 100 meq IV now.  3. Repeat ABG at 7:15 AM.     Intervention Category Major Interventions: Hypotension - evaluation and management  Rhilee Currin Eugene 02/28/2018, 5:46 AM

## 2018-03-07 NOTE — Progress Notes (Signed)
RN spoke with CDS this morning prior to extubation and patient is not a candidate for organ donation. Rn is to call with time of death.

## 2018-03-07 NOTE — Discharge Summary (Signed)
Physician Discharge Summary         Patient ID: [name] MRN: [id number] DOB/AGE: [birth year] 57 y.o.  Admit date: 2018-02-27 Discharge date: 02/20/2018  Discharge Diagnoses:    Acute hypoxemic hypercarbic respiratory failure following acute cardiopulmonary arrest secondary to massive pulmonary embolism now with refractory cardiogenic shock and likely worsening right ventricular failure.  Hypothermia protocol status post cardiac arrest discontinued secondary to worsening bradycardia and cardiogenic shock  Possible aspiration pneumonia CT scan with early cerebral edema concerning for underlying anoxic brain injury Left-sided tension pneumothorax status post chest tube placement, stable Anion gap milligrams acidosis, lactic acidosis Acute kidney injury, oliguria, severely reduced urine output Leukocytosis Thrombocytopenia Left cold extremity, on heparin, arterial line discontinued Hyperglycemia on SSI Transaminitis, elevated liver enzymes, likely related to shock state post cardiac arrest.  Discharge summary    57 year old female 57 year old patient admitted on 9/8 following witnessed cardiac arrest after syncopal episode.  Intubated in the field,Time to return of spontaneous circulation estimated at greater than 12 minutes given CPR report.  Was rushed to Cath Lab, found to have clean coronary arteries.  Following this CT head and chest were obtained.  CT chest demonstrated massive pulmonary embolus, also pneumothorax with tension appearance.  CT brain demonstrated diffuse brain edema.  She was admitted to the intensive care and hypothermia protocol was initiated. 9/9: Seen by neurology.  Felt CT imaging consistent with early anoxic injury.  In refractory shock, high-dose epinephrine.  Having trouble oxygenating patient requiring high PEEP/FiO2.  Remains acidemic with a mix of both respiratory and metabolic component ventilator changes made. Aline removed from left arm as hand cold.    The decision was made for palliative withdrawal. The patient died peacefully with family at the bedside.   Discharge Exam: BP (!) 77/54   Pulse 94   Temp 98.6 F (37 C) (Bladder)   Resp (!) 21   Ht 5\' 4"  (1.626 m)   Wt (!) 137.6 kg   LMP 12/03/2010   SpO2 97%   BMI 52.07 kg/m   I did not examine the patient at the time of death.   Labs at discharge   Lab Results  Component Value Date   CREATININE 3.40 (H) 02/18/2018   BUN 27 (H) 03/04/2018   NA 140 02/06/2018   K 5.4 (H) 02/18/2018   CL 102 02/15/2018   CO2 22 02/06/2018   Lab Results  Component Value Date   WBC 17.9 (H) 03/01/2018   HGB 14.9 02/21/2018   HCT 45.7 03/01/2018   MCV 86.7 02/23/2018   PLT 59 (L) 02/18/2018   Lab Results  Component Value Date   ALT 456 (H) 02/13/2018   AST 831 (H) 02/13/2018   ALKPHOS 77 02/13/2018   BILITOT 0.8 02/13/2018   Lab Results  Component Value Date   INR 1.92 02/13/2018   INR 1.98 02/27/2018   INR 1.66 27-Feb-2018    Current radiological studies    Ct Head Wo Contrast  Result Date: 2018/02/27 CLINICAL DATA:  Cardiac arrest. EXAM: CT HEAD WITHOUT CONTRAST TECHNIQUE: Contiguous axial images were obtained from the base of the skull through the vertex without intravenous contrast. COMPARISON:  None. FINDINGS: Brain: There is mild motion artifact which limits assessment. The ventricles and sulci are not completely effaced but are smaller than expected for patient age, however no prior head imaging is available to determine the patient's baseline cerebral volume. There may be early loss of gray-white differentiation. No focal acute infarct, intracranial hemorrhage, mass, midline  shift, or extra-axial fluid collection is identified. Vascular: Intravascular contrast material from cardiac catheterization. Skull: No fracture or focal osseous lesion. Sinuses/Orbits: Visualized paranasal sinuses and mastoid air cells are clear. Orbits are unremarkable. Other: None. IMPRESSION:  Finding suspicious for early cerebral edema from diffuse anoxic injury. Electronically Signed   By: Sebastian Ache M.D.   On: 02/05/2018 18:54   Ct Angio Chest Pe W Or Wo Contrast  Result Date: 03/06/2018 CLINICAL DATA:  Stopped breathing during a syncopal episode. Clinical concern for pulmonary embolism. EXAM: CT ANGIOGRAPHY CHEST WITH CONTRAST TECHNIQUE: Multidetector CT imaging of the chest was performed using the standard protocol during bolus administration of intravenous contrast. Multiplanar CT image reconstructions and MIPs were obtained to evaluate the vascular anatomy. CONTRAST:  80mL ISOVUE-370 IOPAMIDOL (ISOVUE-370) INJECTION 76% COMPARISON:  Portable chest obtained earlier today. FINDINGS: Cardiovascular: Multiple moderately large bilateral pulmonary arterial filling defects. Mildly enlarged heart. The right ventricular to left ventricular ratio is 1.38. The main pulmonary artery segment is enlarged, measuring 3.6 cm in maximum diameter. Mediastinum/Nodes: Nasogastric tube extending into the stomach. Endotracheal tube tip 7 mm above the carina. No enlarged lymph nodes. Unremarkable thyroid gland. No mediastinal shift. Lungs/Pleura: Approximately 50% left pneumothorax. Patchy and confluent opacities in both lungs. These are primarily peripheral and wedge shaped. Upper Abdomen: Reflux of contrast into the hepatic veins. Small amount of free peritoneal fluid. Musculoskeletal: Extensive left lateral subcutaneous emphysema, extending into the left anterior chest and left lower neck. No rib fractures are seen. Thoracic and lower cervical spine degenerative changes. Review of the MIP images confirms the above findings. IMPRESSION: 1. Positive for acute PE with CT evidence of right heart strain (RV/LV Ratio = 1.38) consistent with at least submassive (intermediate risk) PE, although the presence of right heart strain has been associated with an increased risk of morbidity and mortality. Please activate Code PE  by paging [phone]. 2. Approximately 50% left pneumothorax without mediastinal shift. 3. Multiple peripheral wedge-shaped areas of patchy and confluent airspace opacity in both lungs. These may represent multiple pulmonary infarcts associated with the bilateral pulmonary emboli. Atelectasis/pneumonia can also have this appearance. 4. Extensive left subcutaneous emphysema, extending into the lower neck on the left. 5. Endotracheal tube tip 7 mm above the carina. It is recommended that this be retracted 3 cm. 6. Enlarged main pulmonary artery. This could be due to the moderately large bilateral pulmonary emboli or pulmonary hypertension. Emphysema (ICD10-J43.9). Critical Value/emergent results were called by telephone at the time of interpretation on 02/28/2018 at 7:02 pm to Dr. Gillermina Phy , who verbally acknowledged these results. Electronically Signed   By: Beckie Salts M.D.   On: 02/07/2018 19:08   Dg Chest Port 1 View  Result Date: 02-22-2018 CLINICAL DATA:  Intubated, pneumonia EXAM: PORTABLE CHEST 1 VIEW COMPARISON:  Chest radiograph from one day prior. FINDINGS: Right rotated chest radiograph. Left internal jugular central venous catheter terminates in right upper mediastinum with curved tip probably within the azygos vein. Endotracheal tube tip is 3.7 cm above the carina. Enteric tube enters stomach with the tip not seen on this image. Left chest tube is stable. Stable cardiomediastinal silhouette with normal heart size. No pneumothorax. Possible small right pleural effusion. No significant left pleural effusion. Hazy bilateral parahilar and bibasilar lung opacities appear mildly increased, particularly at the right lung base. IMPRESSION: 1. Stable curved tip of left internal jugular central venous catheter in the right upper mediastinum, probably within the azygos vein. Recommend repositioning. 2. Well-positioned endotracheal and  enteric and left chest tubes. No pneumothorax. 3. Hazy bilateral  parahilar and bibasilar lung opacities, mildly increased particularly at the right lung base, which could represent atelectasis, aspiration or pneumonia. Electronically Signed   By: Delbert Phenix M.D.   On: 03/01/2018 09:28   Dg Chest Port 1 View  Result Date: 02/13/2018 CLINICAL DATA:  Pneumothorax. EXAM: PORTABLE CHEST 1 VIEW COMPARISON:  Radiograph yesterday at 1918 hour, chest CT yesterday. FINDINGS: Left chest catheter with tip near the apex without visualized pneumothorax. Subcutaneous emphysema again seen throughout the chest wall, decreased from prior exam. Endotracheal tube tip 2 cm from the carina. Enteric tube in place with tip below the diaphragm. Left central line, jugular catheter based on prior CT, tip in the region of the brachiocephalic SVC confluence, tip appears curved and directed centrally. Lung volumes are low. Unchanged heart size and mediastinal contours. Bibasilar atelectasis. No right pneumothorax. IMPRESSION: 1. Left pigtail catheter in place without visualized pneumothorax. Decreasing subcutaneous emphysema in the left chest wall. 2. Tip of the left central line in the region of the proximal SVC, however appears curved and directed centrally, recommend repositioning. 3. Low lung volumes with bibasilar atelectasis. Electronically Signed   By: Narda Rutherford M.D.   On: 02/13/2018 01:59   Dg Chest Port 1 View  Result Date: Mar 05, 2018 CLINICAL DATA:  Left chest tube placement. EXAM: PORTABLE CHEST 1 VIEW COMPARISON:  Portable chest and chest CTA obtained earlier today. FINDINGS: Interval small caliber left chest tube with its tip near the left lung apex. No pneumothorax seen. Left subcutaneous emphysema. Endotracheal tube in satisfactory position. Nasogastric tube extending into the stomach. The left jugular catheter tip is not visualized, possibly overlying a lead at the level of the proximal superior vena cava. Stable enlarged cardiac silhouette. There is some increased density in  the medial aspects of both upper lobes, corresponding to the areas of opacity seen on the CTA. Lower thoracic spine degenerative changes. IMPRESSION: 1. Interval small caliber left chest tube without pneumothorax. 2. Left subcutaneous emphysema. 3. The left jugular catheter appears to been retracted with its tip possibly at the origin of the superior vena cava. This is difficult to determine due to overlying leads. 4. Stable cardiomegaly and interval bilateral upper lobe atelectasis, pneumonia or pulmonary infarcts. Electronically Signed   By: Beckie Salts M.D.   On: 03/05/18 20:03   Dg Chest Port 1 View  Result Date: 05-Mar-2018 CLINICAL DATA:  Central line placement. EXAM: PORTABLE CHEST 1 VIEW COMPARISON:  March 05, 2018. FINDINGS: Endotracheal tube in satisfactory position. Nasogastric tube extending into the stomach. Interval left jugular catheter with its tip in the inferior aspect of the superior vena cava. No pneumothorax. Clear lungs. Thoracic spine degenerative changes. IMPRESSION: Left jugular catheter tip in the inferior aspect of the superior vena cava without pneumothorax. Electronically Signed   By: Beckie Salts M.D.   On: 03-05-2018 18:12   Dg Chest Portable 1 View  Result Date: 2018-03-05 CLINICAL DATA:  Intubation EXAM: PORTABLE CHEST 1 VIEW COMPARISON:  None. FINDINGS: The ETT is in good position. No pneumothorax. The NG tube terminates below today's film. Probable cardiomegaly. The hila and mediastinum are unremarkable given portable technique. No pulmonary nodules or masses. No focal infiltrates. IMPRESSION: 1. Support apparatus as above. 2. The heart and mediastinum are prominent, probably due to portable technique. Recommend attention on follow-up. 3. No other abnormalities. Electronically Signed   By: Gerome Sam III M.D   On: 2018-03-05 14:43    Disposition:  Deceased   Signed: Josephine Igo 03/11/2018, 11:57 AM

## 2018-03-07 NOTE — Progress Notes (Signed)
Called [facility] and spoke w/[facility] RN of pt being hypotensive 74/41 w/MAP of 47.  I have maxed out on Levo running at 80 mcg and Vasopressing running at 0.03   Also notified Dr. Cherie Ouch who is here w/another pt.... Sts she will talk to the family but no further action required at the moment other than to prepare family.

## 2018-03-07 NOTE — Progress Notes (Signed)
PCCM:  I spoke with patients family. They are agreeable to removal of the ETT and palliative withdrawal of care.   Orders placed for extubation.   Josephine Igo, DO [facility] 02/15/2018 7:44 AM  Personal pager: [phone] If unanswered, please page CCM On-call: #[phone]

## 2018-03-07 NOTE — Progress Notes (Signed)
Wasted Versed 50mg /58ml compounded bag. Due to patient deceased.

## 2018-03-07 DEATH — deceased

## 2019-04-30 IMAGING — DX DG CHEST 1V PORT
1 series · 1 of 1 positions shown · non-contrast
Comparison: Portable chest and chest CTA obtained earlier today.

CLINICAL DATA: Left chest tube placement.

EXAM:
PORTABLE CHEST 1 VIEW

[chest ap]
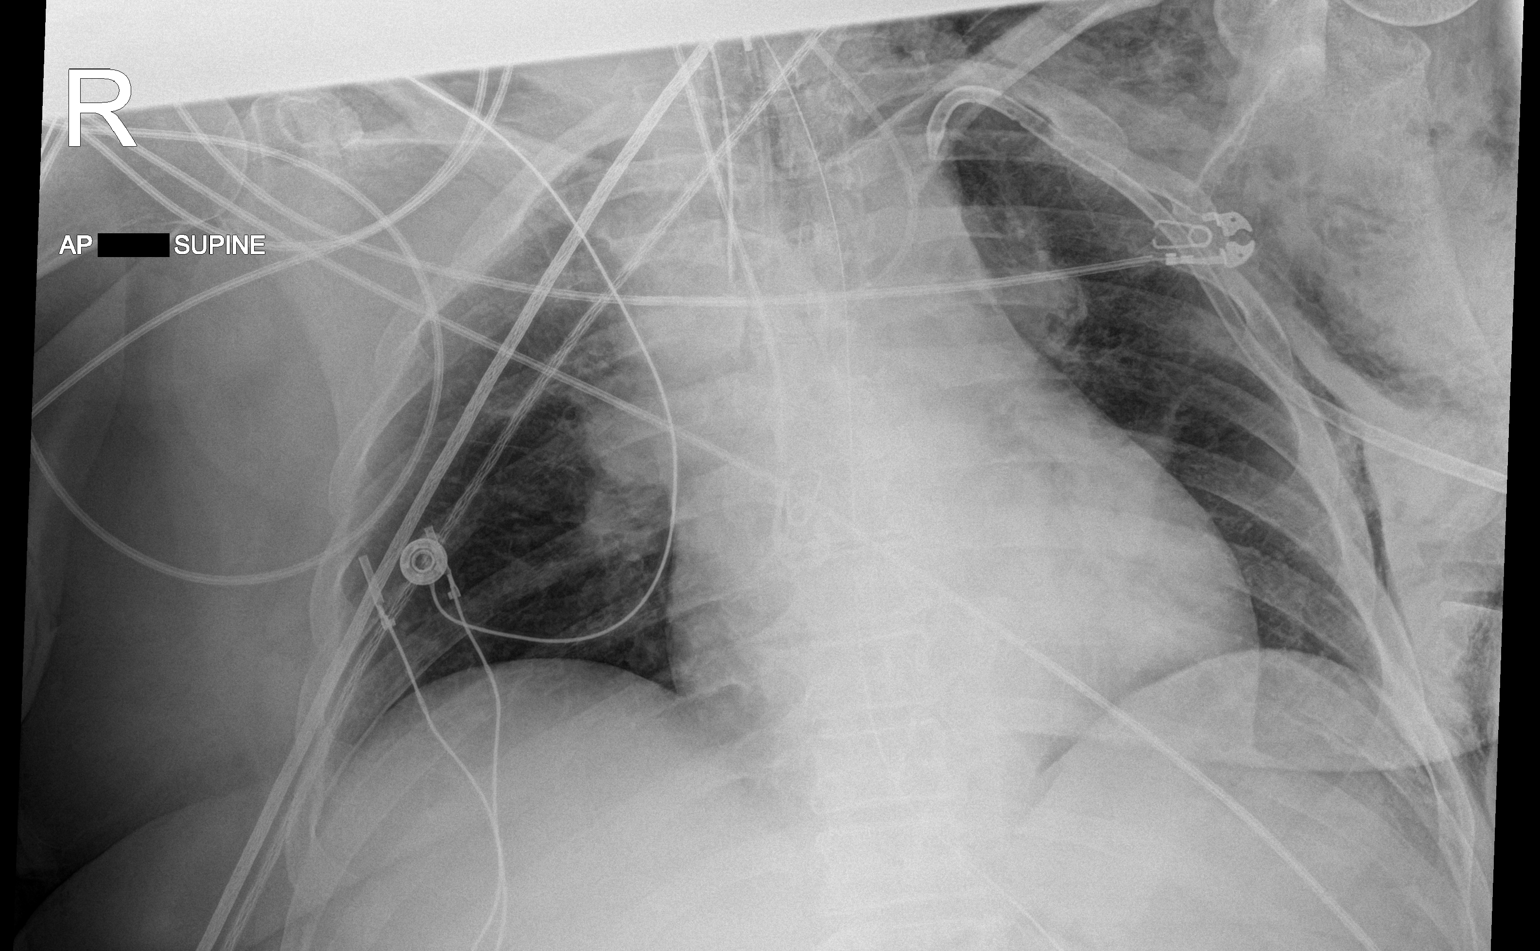

[1 of 1 positions shown; findings below may reference images not displayed]

FINDINGS: Interval small caliber left chest tube with its tip near the left
lung apex. No pneumothorax seen. Left subcutaneous emphysema.
Endotracheal tube in satisfactory position. Nasogastric tube
extending into the stomach. The left jugular catheter tip is not
visualized, possibly overlying a lead at the level of the proximal
superior vena cava. Stable enlarged cardiac silhouette. There is
some increased density in the medial aspects of both upper lobes,
corresponding to the areas of opacity seen on the CTA. Lower
thoracic spine degenerative changes.
IMPRESSION: 1. Interval small caliber left chest tube without pneumothorax.
2. Left subcutaneous emphysema.
3. The left jugular catheter appears to been retracted with its tip
possibly at the origin of the superior vena cava. This is difficult
to determine due to overlying leads.
4. Stable cardiomegaly and interval bilateral upper lobe
atelectasis, pneumonia or pulmonary infarcts.

## 2019-04-30 IMAGING — CT CT ANGIO CHEST
2 of 6 series · 18 of 36 positions shown · IV contrast (iopamidol)
Comparison: Portable chest obtained earlier today.

CLINICAL DATA: Stopped breathing during a syncopal episode.
Clinical concern for pulmonary embolism.

EXAM:
CT ANGIOGRAPHY CHEST WITH CONTRAST
TECHNIQUE: Multidetector CT imaging of the chest was performed using the
standard protocol during bolus administration of intravenous
contrast. Multiplanar CT image reconstructions and MIPs were
obtained to evaluate the vascular anatomy.
CONTRAST:  80mL [id number] IOPAMIDOL ([id number]) INJECTION 76%

[Series 7: pe thins · axial · 0.75mm/px · z∈[-478,-211]mm · 17 of 426 slices shown]
[im 22/426  lung]
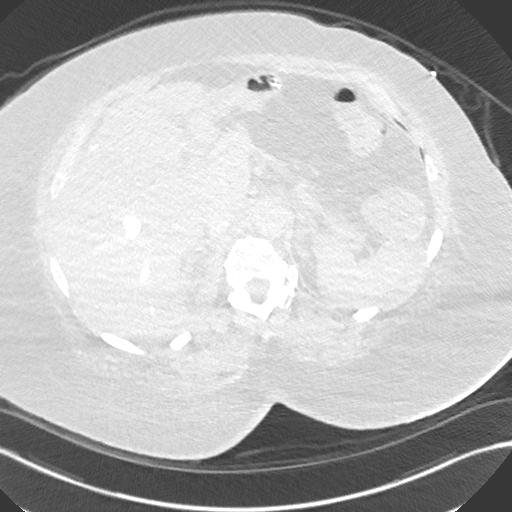
[im 43/426  mediastinal]
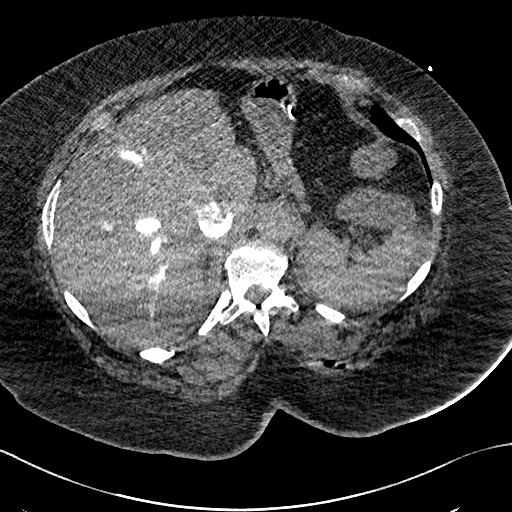
[im 64/426  lung]
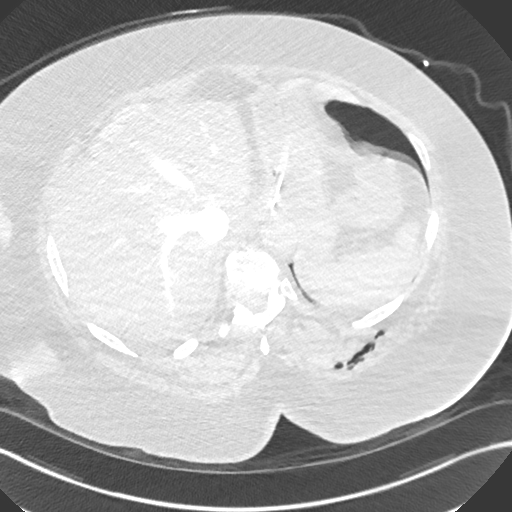
[im 86/426  mediastinal]
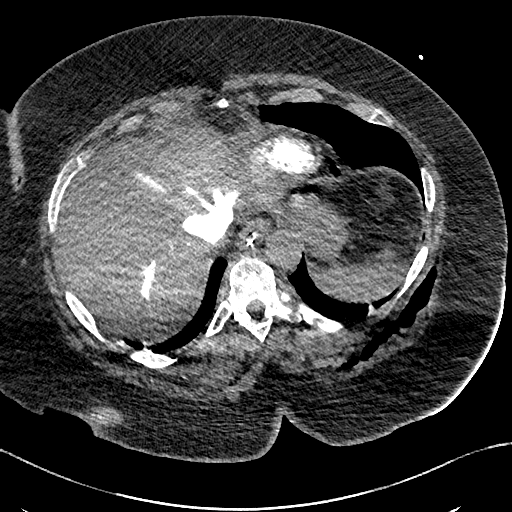
[im 128/426  lung]
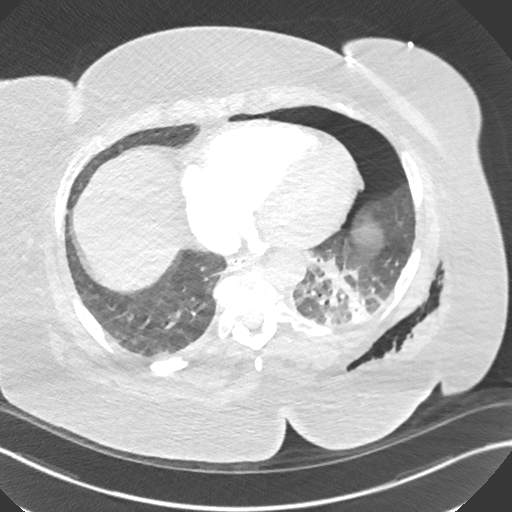
[im 149/426  mediastinal]
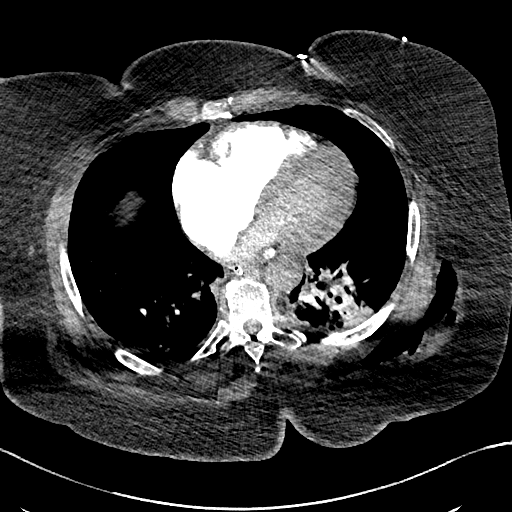
[im 171/426  lung]
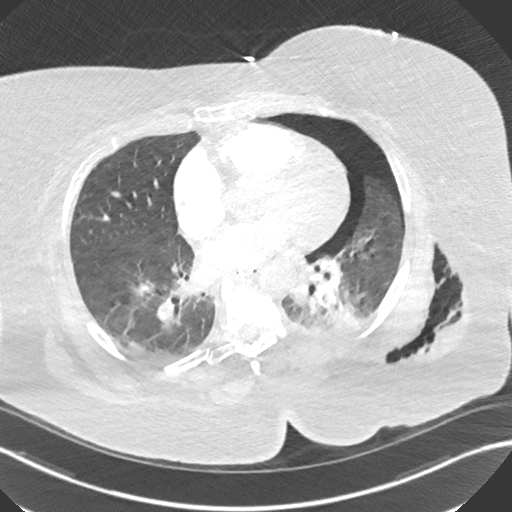
[im 192/426  mediastinal]
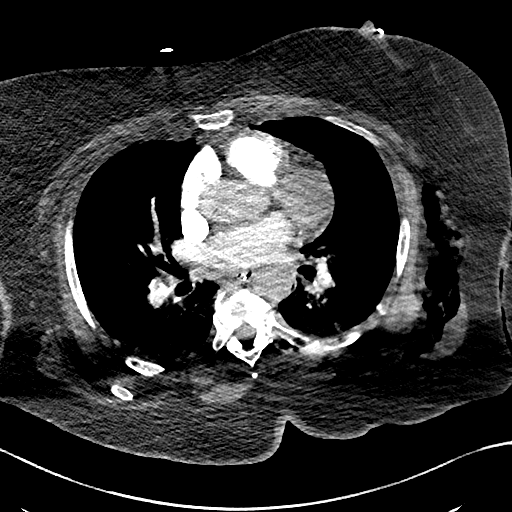
[im 213/426  lung]
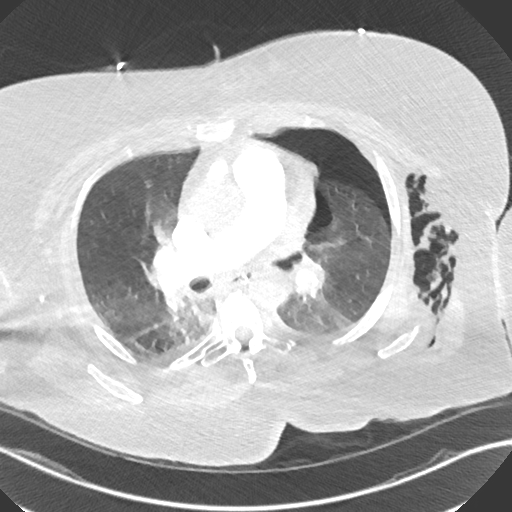
[im 234/426  mediastinal]
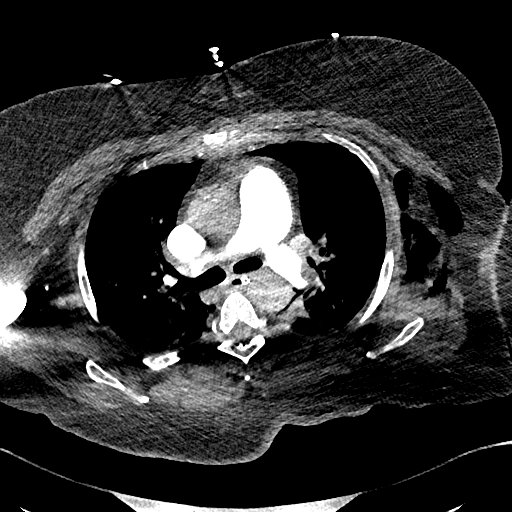
[im 256/426  lung]
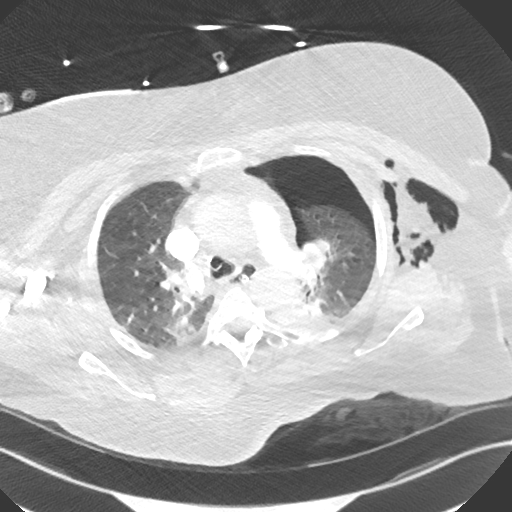
[im 277/426  mediastinal]
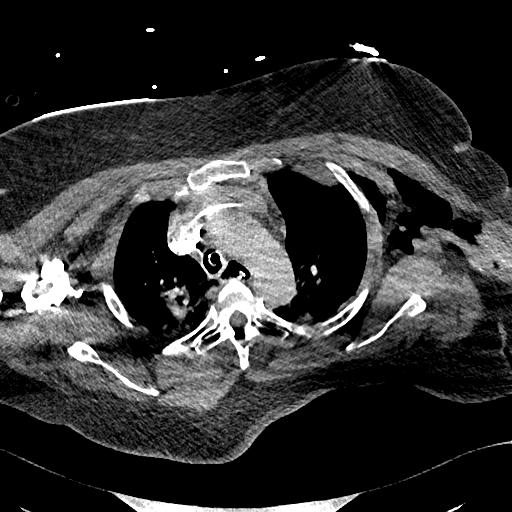
[im 298/426  lung]
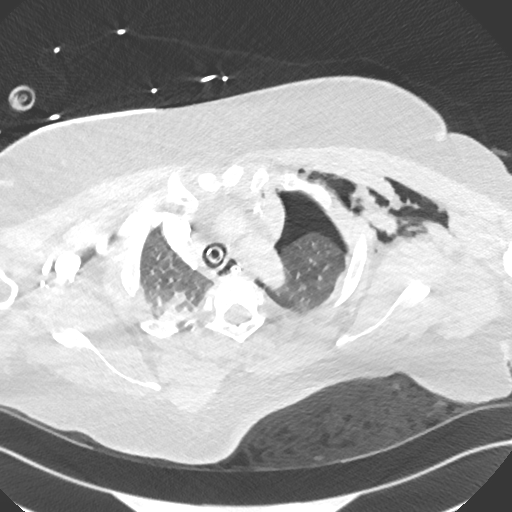
[im 341/426  mediastinal]
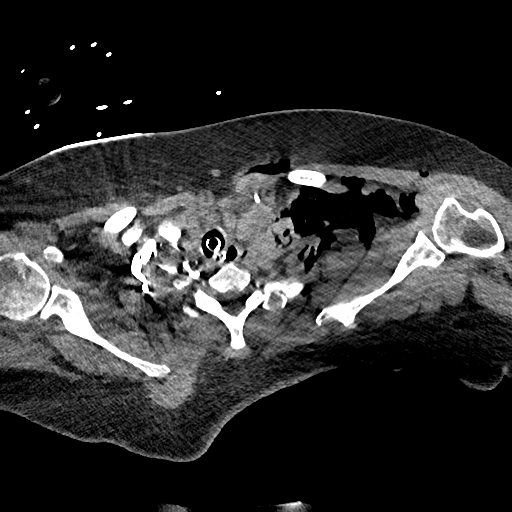
[im 362/426  lung]
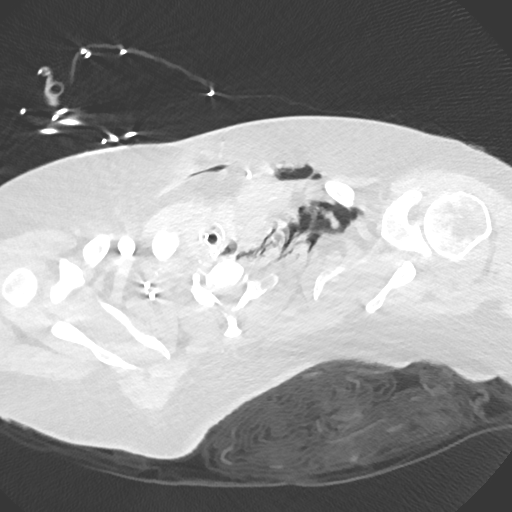
[im 383/426  mediastinal]
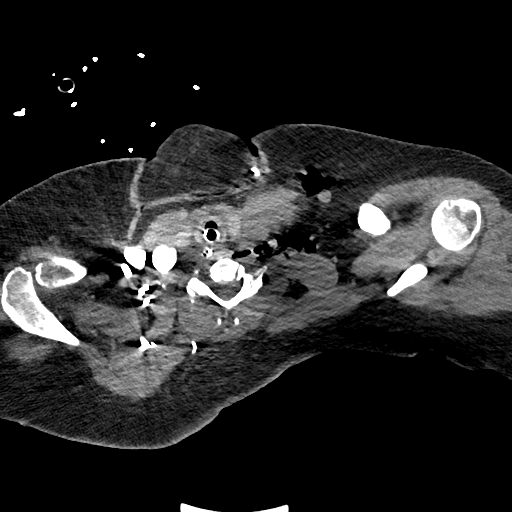
[im 404/426  lung]
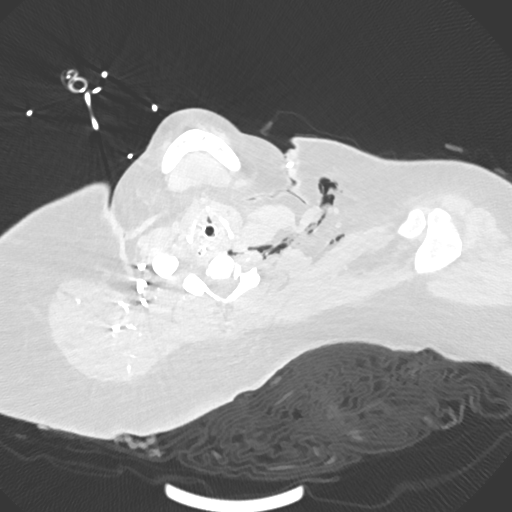

[Series 8: pe 2mm cor · coronal · 0.59mm/px · 1 of 155 slices shown]
[im 78/155  mediastinal]
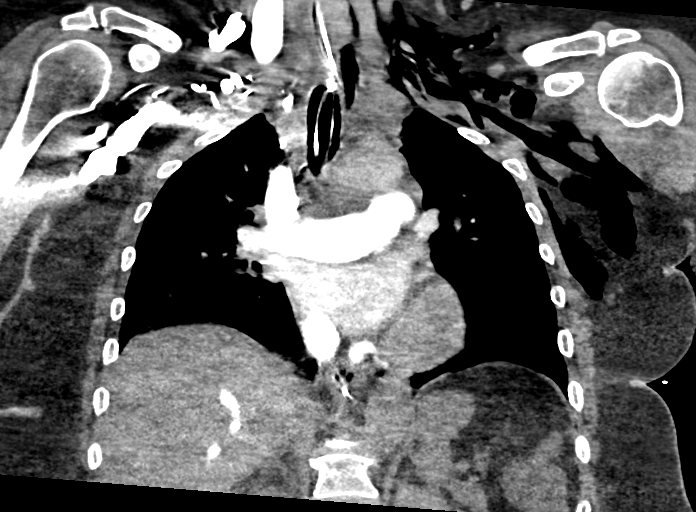

[18 of 36 positions shown; findings below may reference images not displayed]

FINDINGS: Cardiovascular: Multiple moderately large bilateral pulmonary
arterial filling defects. Mildly enlarged heart. The right
ventricular to left ventricular ratio is 1.38. The main pulmonary
artery segment is enlarged, measuring 3.6 cm in maximum diameter.

Mediastinum/Nodes: Nasogastric tube extending into the stomach.
Endotracheal tube tip 7 mm above the carina. No enlarged lymph
nodes. Unremarkable thyroid gland. No mediastinal shift.

Lungs/Pleura: Approximately 50% left pneumothorax. Patchy and
confluent opacities in both lungs. These are primarily peripheral
and wedge shaped.

Upper Abdomen: Reflux of contrast into the hepatic veins. Small
amount of free peritoneal fluid.

Musculoskeletal: Extensive left lateral subcutaneous emphysema,
extending into the left anterior chest and left lower neck. No rib
fractures are seen. Thoracic and lower cervical spine degenerative
changes.

Review of the MIP images confirms the above findings.
IMPRESSION: 1. Positive for acute PE with CT evidence of right heart strain
(RV/LV Ratio = 1.38) consistent with at least submassive
(intermediate risk) PE, although the presence of right heart strain
has been associated with an increased risk of morbidity and
mortality. Please activate Code PE by paging [phone].
2. Approximately 50% left pneumothorax without mediastinal shift.
3. Multiple peripheral wedge-shaped areas of patchy and confluent
airspace opacity in both lungs. These may represent multiple
pulmonary infarcts associated with the bilateral pulmonary emboli.
Atelectasis/pneumonia can also have this appearance.
4. Extensive left subcutaneous emphysema, extending into the lower
neck on the left.
5. Endotracheal tube tip 7 mm above the carina. It is recommended
that this be retracted 3 cm.
6. Enlarged main pulmonary artery. This could be due to the
moderately large bilateral pulmonary emboli or pulmonary
hypertension.

Emphysema ([id number]).

Critical Value/emergent results were called by telephone at the time
of interpretation on 02/12/2018 at [DATE] to Dr. KODZOTSE PROCESS , who
verbally acknowledged these results.
